# Patient Record
Sex: Female | Born: 1985
Health system: Southern US, Community
[De-identification: ages and names within clinical notes are randomized; demographics above are authoritative.]

## PROBLEM LIST (undated history)

## (undated) DIAGNOSIS — D219 Benign neoplasm of connective and other soft tissue, unspecified: Secondary | ICD-10-CM

---

## 2011-08-29 HISTORY — PX: MYOMECTOMY: SHX85

## 2016-01-24 ENCOUNTER — Encounter (HOSPITAL_COMMUNITY): Payer: Self-pay | Admitting: Radiology

## 2016-01-24 ENCOUNTER — Emergency Department (HOSPITAL_COMMUNITY): Payer: Medicaid Other

## 2016-01-24 ENCOUNTER — Emergency Department (HOSPITAL_COMMUNITY)
Admission: EM | Admit: 2016-01-24 | Discharge: 2016-01-24 | Disposition: A | Discharge status: Home / Self Care | Payer: Medicaid Other | Attending: Emergency Medicine | Admitting: Emergency Medicine

## 2016-01-24 DIAGNOSIS — R109 Unspecified abdominal pain: Secondary | ICD-10-CM

## 2016-01-24 DIAGNOSIS — K529 Noninfective gastroenteritis and colitis, unspecified: Secondary | ICD-10-CM

## 2016-01-24 DIAGNOSIS — R1033 Periumbilical pain: Secondary | ICD-10-CM | POA: Diagnosis present

## 2016-01-24 LAB — URINALYSIS, ROUTINE W REFLEX MICROSCOPIC
Bilirubin Urine: NEGATIVE
Glucose, UA: NEGATIVE mg/dL
Hgb urine dipstick: NEGATIVE
Ketones, ur: 15 mg/dL — AB
Leukocytes, UA: NEGATIVE
Nitrite: NEGATIVE
Protein, ur: NEGATIVE mg/dL
Specific Gravity, Urine: >1.046 — ABNORMAL HIGH (ref 1.005–1.030)
pH: 7.5 (ref 5.0–8.0)

## 2016-01-24 LAB — DIFFERENTIAL
Basophils Absolute: 0 K/uL (ref 0.0–0.1)
Basophils Relative: 0 %
Eosinophils Absolute: 0 K/uL (ref 0.0–0.7)
Eosinophils Relative: 0 %
Lymphocytes Relative: 17 %
Lymphs Abs: 1.4 K/uL (ref 0.7–4.0)
Monocytes Absolute: 0.2 K/uL (ref 0.1–1.0)
Monocytes Relative: 3 %
Neutro Abs: 6.7 K/uL (ref 1.7–7.7)
Neutrophils Relative %: 80 %

## 2016-01-24 LAB — COMPREHENSIVE METABOLIC PANEL
ALT: 21 U/L (ref 14–54)
AST: 28 U/L (ref 15–41)
Albumin: 5.2 g/dL — ABNORMAL HIGH (ref 3.5–5.0)
Alkaline Phosphatase: 64 U/L (ref 38–126)
Anion gap: 13 (ref 5–15)
BUN: 13 mg/dL (ref 6–20)
CHLORIDE: 101 mmol/L (ref 101–111)
CO2: 21 mmol/L — ABNORMAL LOW (ref 22–32)
Calcium: 10 mg/dL (ref 8.9–10.3)
Creatinine, Ser: 0.93 mg/dL (ref 0.44–1.00)
Glucose, Bld: 135 mg/dL — ABNORMAL HIGH (ref 65–99)
POTASSIUM: 3.6 mmol/L (ref 3.5–5.1)
Sodium: 135 mmol/L (ref 135–145)
Total Bilirubin: 0.8 mg/dL (ref 0.3–1.2)
Total Protein: 9.5 g/dL — ABNORMAL HIGH (ref 6.5–8.1)

## 2016-01-24 LAB — CBC
HEMATOCRIT: 38.3 % (ref 36.0–46.0)
Hemoglobin: 13.2 g/dL (ref 12.0–15.0)
MCH: 28.8 pg (ref 26.0–34.0)
MCHC: 34.5 g/dL (ref 30.0–36.0)
MCV: 83.6 fL (ref 78.0–100.0)
Platelets: 300 10*3/uL (ref 150–400)
RBC: 4.58 MIL/uL (ref 3.87–5.11)
RDW: 14.3 % (ref 11.5–15.5)
WBC: 8.4 10*3/uL (ref 4.0–10.5)

## 2016-01-24 LAB — LIPASE, BLOOD: Lipase: 34 U/L (ref 11–51)

## 2016-01-24 LAB — I-STAT BETA HCG BLOOD, ED (MC, WL, AP ONLY)
I-stat hCG, quantitative: <5 m[IU]/mL
I-stat hCG, quantitative: <5 m[IU]/mL

## 2016-01-24 MED ORDER — HYDROMORPHONE HCL 1 MG/ML IJ SOLN
1.0000 mg | Freq: Once | INTRAMUSCULAR | Status: AC
  Administered 2016-01-24: 1 mg via INTRAVENOUS
  Filled 2016-01-24: qty 1

## 2016-01-24 MED ORDER — DIATRIZOATE MEGLUMINE & SODIUM 66-10 % PO SOLN
30.0000 mL | Freq: Once | ORAL | Status: AC
  Administered 2016-01-24: 30 mL via ORAL

## 2016-01-24 MED ORDER — ONDANSETRON 4 MG PO TBDP: ORAL_TABLET | ORAL | Status: DC

## 2016-01-24 MED ORDER — IOPAMIDOL (ISOVUE-300) INJECTION 61%: 100.0000 mL | Freq: Once | INTRAVENOUS | Status: DC | PRN

## 2016-01-24 MED ORDER — CIPROFLOXACIN HCL 500 MG PO TABS: 500.0000 mg | ORAL_TABLET | Freq: Two times a day (BID) | ORAL | Status: DC

## 2016-01-24 MED ORDER — OXYCODONE HCL 5 MG PO TABS: 5.0000 mg | ORAL_TABLET | ORAL | Status: DC | PRN

## 2016-01-24 MED ORDER — ONDANSETRON HCL 4 MG/2ML IJ SOLN
4.0000 mg | Freq: Once | INTRAMUSCULAR | Status: AC
  Administered 2016-01-24: 4 mg via INTRAVENOUS
  Filled 2016-01-24: qty 2

## 2016-01-24 MED ORDER — SODIUM CHLORIDE 0.9 % IV SOLN
1000.0000 mL | Freq: Once | INTRAVENOUS | Status: AC
  Administered 2016-01-24: 1000 mL via INTRAVENOUS

## 2016-01-24 MED ORDER — SODIUM CHLORIDE 0.9 % IV SOLN
1000.0000 mL | INTRAVENOUS | Status: DC
  Administered 2016-01-24: 1000 mL via INTRAVENOUS

## 2016-01-24 MED ORDER — METRONIDAZOLE 500 MG PO TABS: 500.0000 mg | ORAL_TABLET | Freq: Two times a day (BID) | ORAL | Status: DC

## 2016-01-24 NOTE — ED Notes (Signed)
Pt arrived via EMS. Per EMS pt c/o Abdominal pain, pt is guarding below the navel, LMP ended last week, N/V for last 2 hours, she states she feels like when she had a miscarriage before her first child was born. She was given Zofran 4mg  IV in route.

## 2016-01-24 NOTE — ED Notes (Signed)
Discharge instructions, follow up care, and rx x4 reviewed with patient. Patient verbalized understanding. 

## 2016-01-24 NOTE — ED Notes (Signed)
Bed: [id number] Expected date:  Expected time:  Means of arrival:  Comments: 32 F abdominal pain

## 2016-01-24 NOTE — ED Notes (Signed)
Patient transported to CT 

## 2016-01-24 NOTE — ED Provider Notes (Signed)
CSN: [id number]     Arrival date & time 01/24/16  [id number] History   First MD Initiated Contact with Patient 01/24/16 0559     Chief Complaint  Patient presents with  . Abdominal Pain     (Consider location/radiation/quality/duration/timing/severity/associated sxs/prior Treatment) Patient is a 30 y.o. female presenting with abdominal pain. The history is provided by the patient.  Abdominal Pain She relates onset last night of periumbilical and lower abdominal pain which is crampy and throbbing and she rates at 10/10. She started vomiting about 2 hours before coming to the ED. Pain is not improved with emesis. She denies fever, chills, sweats. She denies constipation or diarrhea. She denies urinary symptoms. Last menses was one week ago was normal but she is not using any contraception. She states that that was her second menses since the birth of her most recent child. Pain is similar to what she had with a miscarriage in the past. She also relates a history of uterine fibroids.  No past medical history on file. No past surgical history on file. No family history on file. Social History  Substance Use Topics  . Smoking status: Not on file  . Smokeless tobacco: Not on file  . Alcohol Use: Not on file   OB History    No data available     Review of Systems  Gastrointestinal: Positive for abdominal pain.  All other systems reviewed and are negative.     Allergies  Review of patient's allergies indicates no known allergies.  Home Medications   Prior to Admission medications   Not on File   BP 116/97 mmHg  Pulse 90  Temp(Src) 98.6 F (37 C) (Oral)  Resp 26  SpO2 100% Physical Exam  Nursing note and vitals reviewed.  30 year old female, who appears to be in significant pain, but is in no acute distress. Vital signs are significant for tachypnea. Oxygen saturation is 100%, which is normal. Head is normocephalic and atraumatic. PERRLA, EOMI. Oropharynx is clear. Neck is  nontender and supple without adenopathy or JVD. Back is nontender and there is no CVA tenderness. Lungs are clear without rales, wheezes, or rhonchi. Chest is nontender. Heart has regular rate and rhythm without murmur. Abdomen is soft, flat, with moderate to severe periumbilical tenderness. There is no suprapubic tenderness. There is no rebound or guarding. There are no masses or hepatosplenomegaly and peristalsis is normoactive. Extremities have no cyanosis or edema, full range of motion is present. Skin is warm and dry without rash. Neurologic: Mental status is normal, cranial nerves are intact, there are no motor or sensory deficits.  ED Course  Procedures (including critical care time) Labs Review Results for orders placed or performed during the hospital encounter of 01/24/16  Lipase, blood  Result Value Ref Range   Lipase 34 11 - 51 U/L  Comprehensive metabolic panel  Result Value Ref Range   Sodium 135 135 - 145 mmol/L   Potassium 3.6 3.5 - 5.1 mmol/L   Chloride 101 101 - 111 mmol/L   CO2 21 (L) 22 - 32 mmol/L   Glucose, Bld 135 (H) 65 - 99 mg/dL   BUN 13 6 - 20 mg/dL   Creatinine, Ser 0.93 0.44 - 1.00 mg/dL   Calcium 10.0 8.9 - 10.3 mg/dL   Total Protein 9.5 (H) 6.5 - 8.1 g/dL   Albumin 5.2 (H) 3.5 - 5.0 g/dL   AST 28 15 - 41 U/L   ALT 21 14 - 54 U/L  Alkaline Phosphatase 64 38 - 126 U/L   Total Bilirubin 0.8 0.3 - 1.2 mg/dL   GFR calc non Af Amer >60 >60 mL/min   GFR calc Af Amer >60 >60 mL/min   Anion gap 13 5 - 15  CBC  Result Value Ref Range   WBC 8.4 4.0 - 10.5 K/uL   RBC 4.58 3.87 - 5.11 MIL/uL   Hemoglobin 13.2 12.0 - 15.0 g/dL   HCT 38.3 36.0 - 46.0 %   MCV 83.6 78.0 - 100.0 fL   MCH 28.8 26.0 - 34.0 pg   MCHC 34.5 30.0 - 36.0 g/dL   RDW 14.3 11.5 - 15.5 %   Platelets 300 150 - 400 K/uL  Differential  Result Value Ref Range   Neutrophils Relative % 80 %   Neutro Abs 6.7 1.7 - 7.7 K/uL   Lymphocytes Relative 17 %   Lymphs Abs 1.4 0.7 - 4.0 K/uL    Monocytes Relative 3 %   Monocytes Absolute 0.2 0.1 - 1.0 K/uL   Eosinophils Relative 0 %   Eosinophils Absolute 0.0 0.0 - 0.7 K/uL   Basophils Relative 0 %   Basophils Absolute 0.0 0.0 - 0.1 K/uL  I-Stat beta hCG blood, ED  Result Value Ref Range   I-stat hCG, quantitative <5.0 <5 mIU/mL   Comment 3          I-Stat beta hCG blood, ED ([facility], [facility], [facility] only)  Result Value Ref Range   I-stat hCG, quantitative <5.0 <5 mIU/mL   Comment 3           I have personally reviewed and evaluated these lab results as part of my medical decision-making.   MDM   Final diagnoses:  Abdominal pain, unspecified abdominal location    Abdominal pain of uncertain cause. Screening labs are obtained and she will be sent for CT of abdomen and pelvis. In the meantime, she is given IV fluids, hydromorphone, ondansetron.  She feels much better after above noted treatment. Laboratory evaluation is unremarkable. CT is pending. Case is signed out to Dr. Tyrone Nine.  Janet Fuel, MD [id number]

## 2016-01-24 NOTE — ED Provider Notes (Signed)
Received patient in turnover from Dr. Roxanne Mins, 30 year old female with periumbilical abdominal pain. CT scan concerning for enteritis. Will treat with Cipro and Flagyl. As I am treating with ciprofloxacin feel no reason to wait for UA. We'll have the patient follow-up with her family physician. Tolerating PO with improved abdominal pain on reexam.   Deno Etienne, DO 01/24/16 [id number]

## 2016-01-24 NOTE — Discharge Instructions (Signed)
Follow up with your family doc.  Return for inability to eat or drink or sudden worsening pain.  Abdominal Pain, Adult Many things can cause abdominal pain. Usually, abdominal pain is not caused by a disease and will improve without treatment. It can often be observed and treated at home. Your health care provider will do a physical exam and possibly order blood tests and X-rays to help determine the seriousness of your pain. However, in many cases, more time must pass before a clear cause of the pain can be found. Before that point, your health care provider may not know if you need more testing or further treatment. HOME CARE INSTRUCTIONS Monitor your abdominal pain for any changes. The following actions may help to alleviate any discomfort you are experiencing:  Only take over-the-counter or prescription medicines as directed by your health care provider.  Do not take laxatives unless directed to do so by your health care provider.  Try a clear liquid diet (broth, tea, or water) as directed by your health care provider. Slowly move to a bland diet as tolerated. SEEK MEDICAL CARE IF:  You have unexplained abdominal pain.  You have abdominal pain associated with nausea or diarrhea.  You have pain when you urinate or have a bowel movement.  You experience abdominal pain that wakes you in the night.  You have abdominal pain that is worsened or improved by eating food.  You have abdominal pain that is worsened with eating fatty foods.  You have a fever. SEEK IMMEDIATE MEDICAL CARE IF:  Your pain does not go away within 2 hours.  You keep throwing up (vomiting).  Your pain is felt only in portions of the abdomen, such as the right side or the left lower portion of the abdomen.  You pass bloody or black tarry stools. MAKE SURE YOU:  Understand these instructions.  Will watch your condition.  Will get help right away if you are not doing well or get worse.   This information is  not intended to replace advice given to you by your health care provider. Make sure you discuss any questions you have with your health care provider.   Document Released: 05/24/2005 Document Revised: 05/05/2015 Document Reviewed: 04/23/2013 Elsevier Interactive Patient Education [employer].

## 2016-01-25 ENCOUNTER — Inpatient Hospital Stay (HOSPITAL_COMMUNITY)
Admission: EM | Admit: 2016-01-25 | Discharge: 2016-01-31 | DRG: 336 | Disposition: A | Discharge status: Home / Self Care | Payer: Medicaid Other | Attending: Surgery | Admitting: Surgery

## 2016-01-25 ENCOUNTER — Encounter (HOSPITAL_COMMUNITY): Payer: Self-pay | Admitting: *Deleted

## 2016-01-25 DIAGNOSIS — K565 Intestinal adhesions [bands] with obstruction (postprocedural) (postinfection): Principal | ICD-10-CM | POA: Diagnosis present

## 2016-01-25 DIAGNOSIS — K567 Ileus, unspecified: Secondary | ICD-10-CM | POA: Diagnosis not present

## 2016-01-25 DIAGNOSIS — E860 Dehydration: Secondary | ICD-10-CM | POA: Diagnosis present

## 2016-01-25 DIAGNOSIS — D62 Acute posthemorrhagic anemia: Secondary | ICD-10-CM | POA: Diagnosis present

## 2016-01-25 DIAGNOSIS — R188 Other ascites: Secondary | ICD-10-CM | POA: Diagnosis present

## 2016-01-25 DIAGNOSIS — R040 Epistaxis: Secondary | ICD-10-CM | POA: Diagnosis not present

## 2016-01-25 DIAGNOSIS — E875 Hyperkalemia: Secondary | ICD-10-CM | POA: Diagnosis present

## 2016-01-25 DIAGNOSIS — K56609 Unspecified intestinal obstruction, unspecified as to partial versus complete obstruction: Secondary | ICD-10-CM | POA: Diagnosis present

## 2016-01-25 HISTORY — DX: Benign neoplasm of connective and other soft tissue, unspecified: D21.9

## 2016-01-25 NOTE — ED Notes (Addendum)
Pt arrives to the ER via EMS for complaints of abd pain; pt was seen yesterday for the same thing and was prescribed 7 Oxycodone tablets; pt has taken all the tablets and continues to c/o pain; pt c/o pain in all 4 quads; no nausea or vomiting noted; pt labs and CT scan were WNL yesterday

## 2016-01-25 NOTE — ED Notes (Signed)
Per EMS pt was ambulatory at house and ambulatory to the ambulance; upon transferring pt from the EMS stretcher to the wheelchair pt attempted to fall out in the floor and when was advised that she would be going to the lobby pt attempted to throw herself from the wheelchair

## 2016-01-26 ENCOUNTER — Emergency Department (HOSPITAL_COMMUNITY): Payer: Medicaid Other

## 2016-01-26 ENCOUNTER — Inpatient Hospital Stay (HOSPITAL_COMMUNITY): Payer: Medicaid Other

## 2016-01-26 ENCOUNTER — Encounter (HOSPITAL_COMMUNITY): Admission: EM | Disposition: A | Discharge status: Home / Self Care | Payer: Self-pay | Source: Home / Self Care

## 2016-01-26 ENCOUNTER — Encounter (HOSPITAL_COMMUNITY): Payer: Self-pay | Admitting: Emergency Medicine

## 2016-01-26 ENCOUNTER — Inpatient Hospital Stay (HOSPITAL_COMMUNITY): Payer: Medicaid Other | Admitting: Anesthesiology

## 2016-01-26 DIAGNOSIS — K56609 Unspecified intestinal obstruction, unspecified as to partial versus complete obstruction: Secondary | ICD-10-CM | POA: Diagnosis present

## 2016-01-26 DIAGNOSIS — E860 Dehydration: Secondary | ICD-10-CM | POA: Diagnosis present

## 2016-01-26 DIAGNOSIS — D62 Acute posthemorrhagic anemia: Secondary | ICD-10-CM | POA: Diagnosis present

## 2016-01-26 DIAGNOSIS — D219 Benign neoplasm of connective and other soft tissue, unspecified: Secondary | ICD-10-CM | POA: Insufficient documentation

## 2016-01-26 DIAGNOSIS — R109 Unspecified abdominal pain: Secondary | ICD-10-CM | POA: Diagnosis present

## 2016-01-26 DIAGNOSIS — R040 Epistaxis: Secondary | ICD-10-CM | POA: Diagnosis not present

## 2016-01-26 DIAGNOSIS — K567 Ileus, unspecified: Secondary | ICD-10-CM | POA: Diagnosis not present

## 2016-01-26 DIAGNOSIS — K565 Intestinal adhesions [bands] with obstruction (postprocedural) (postinfection): Secondary | ICD-10-CM | POA: Diagnosis present

## 2016-01-26 DIAGNOSIS — E875 Hyperkalemia: Secondary | ICD-10-CM

## 2016-01-26 DIAGNOSIS — K5669 Other intestinal obstruction: Secondary | ICD-10-CM

## 2016-01-26 DIAGNOSIS — R188 Other ascites: Secondary | ICD-10-CM | POA: Diagnosis present

## 2016-01-26 HISTORY — PX: LAPAROTOMY: SHX154

## 2016-01-26 LAB — MAGNESIUM: MAGNESIUM: 2.1 mg/dL (ref 1.7–2.4)

## 2016-01-26 LAB — CBC WITH DIFFERENTIAL/PLATELET
BASOS ABS: 0 10*3/uL (ref 0.0–0.1)
BASOS PCT: 0 %
EOS ABS: 0 10*3/uL (ref 0.0–0.7)
Eosinophils Relative: 0 %
HEMATOCRIT: 42.2 % (ref 36.0–46.0)
HEMOGLOBIN: 14.4 g/dL (ref 12.0–15.0)
Lymphocytes Relative: 12 %
Lymphs Abs: 1.2 10*3/uL (ref 0.7–4.0)
MCH: 29.1 pg (ref 26.0–34.0)
MCHC: 34.1 g/dL (ref 30.0–36.0)
MCV: 85.3 fL (ref 78.0–100.0)
Monocytes Absolute: 0.5 10*3/uL (ref 0.1–1.0)
Monocytes Relative: 5 %
NEUTROS ABS: 7.9 10*3/uL — AB (ref 1.7–7.7)
NEUTROS PCT: 83 %
Platelets: 314 10*3/uL (ref 150–400)
RBC: 4.95 MIL/uL (ref 3.87–5.11)
RDW: 14.6 % (ref 11.5–15.5)
WBC: 9.5 10*3/uL (ref 4.0–10.5)

## 2016-01-26 LAB — URINALYSIS, ROUTINE W REFLEX MICROSCOPIC
GLUCOSE, UA: NEGATIVE mg/dL
HGB URINE DIPSTICK: NEGATIVE
Ketones, ur: >80 mg/dL — AB
Nitrite: NEGATIVE
PH: 5.5 (ref 5.0–8.0)
Protein, ur: 100 mg/dL — AB
SPECIFIC GRAVITY, URINE: 1.033 — AB (ref 1.005–1.030)

## 2016-01-26 LAB — SURGICAL PCR SCREEN
MRSA, PCR: NEGATIVE
Staphylococcus aureus: NEGATIVE

## 2016-01-26 LAB — TYPE AND SCREEN
ABO/RH(D): A POS
ANTIBODY SCREEN: NEGATIVE

## 2016-01-26 LAB — COMPREHENSIVE METABOLIC PANEL
ALBUMIN: 4.9 g/dL (ref 3.5–5.0)
ALT: 17 U/L (ref 14–54)
AST: 28 U/L (ref 15–41)
Alkaline Phosphatase: 61 U/L (ref 38–126)
Anion gap: 12 (ref 5–15)
BILIRUBIN TOTAL: 1.5 mg/dL — AB (ref 0.3–1.2)
BUN: 14 mg/dL (ref 6–20)
CHLORIDE: 99 mmol/L — AB (ref 101–111)
CO2: 22 mmol/L (ref 22–32)
CREATININE: 1.18 mg/dL — AB (ref 0.44–1.00)
Calcium: 10.3 mg/dL (ref 8.9–10.3)
GFR calc Af Amer: >60 mL/min (ref >60)
GLUCOSE: 99 mg/dL (ref 65–99)
POTASSIUM: 3.9 mmol/L (ref 3.5–5.1)
Sodium: 133 mmol/L — ABNORMAL LOW (ref 135–145)
Total Protein: 9.1 g/dL — ABNORMAL HIGH (ref 6.5–8.1)

## 2016-01-26 LAB — POTASSIUM: Potassium: 3.3 mmol/L — ABNORMAL LOW (ref 3.5–5.1)

## 2016-01-26 LAB — I-STAT CHEM 8, ED
BUN: 18 mg/dL (ref 6–20)
CREATININE: 1.1 mg/dL — AB (ref 0.44–1.00)
Calcium, Ion: 1.12 mmol/L (ref 1.12–1.23)
Chloride: 100 mmol/L — ABNORMAL LOW (ref 101–111)
Glucose, Bld: 97 mg/dL (ref 65–99)
HEMATOCRIT: 47 % — AB (ref 36.0–46.0)
HEMOGLOBIN: 16 g/dL — AB (ref 12.0–15.0)
POTASSIUM: 5.6 mmol/L — AB (ref 3.5–5.1)
SODIUM: 135 mmol/L (ref 135–145)
TCO2: 26 mmol/L (ref 0–100)

## 2016-01-26 LAB — URINE MICROSCOPIC-ADD ON: RBC / HPF: NONE SEEN RBC/hpf (ref 0–5)

## 2016-01-26 LAB — ABO/RH: ABO/RH(D): A POS

## 2016-01-26 SURGERY — LAPAROTOMY, EXPLORATORY
Anesthesia: General | Site: Abdomen

## 2016-01-26 MED ORDER — ACETAMINOPHEN 10 MG/ML IV SOLN
INTRAVENOUS | Status: AC
Start: 2016-01-26 — End: 2016-01-26
  Filled 2016-01-26: qty 100

## 2016-01-26 MED ORDER — LIDOCAINE HCL (CARDIAC) 20 MG/ML IV SOLN
INTRAVENOUS | Status: AC
  Filled 2016-01-26: qty 5

## 2016-01-26 MED ORDER — ONDANSETRON HCL 4 MG/2ML IJ SOLN: 4.0000 mg | INTRAMUSCULAR | Status: DC | PRN

## 2016-01-26 MED ORDER — SUGAMMADEX SODIUM 200 MG/2ML IV SOLN
INTRAVENOUS | Status: AC
  Filled 2016-01-26: qty 2

## 2016-01-26 MED ORDER — PANTOPRAZOLE SODIUM 40 MG IV SOLR
40.0000 mg | Freq: Every day | INTRAVENOUS | Status: DC
  Administered 2016-01-26 – 2016-01-30 (×5): 40 mg via INTRAVENOUS
  Filled 2016-01-26 (×5): qty 40

## 2016-01-26 MED ORDER — ONDANSETRON HCL 4 MG/2ML IJ SOLN: 4.0000 mg | Freq: Four times a day (QID) | INTRAMUSCULAR | Status: DC | PRN

## 2016-01-26 MED ORDER — HYDROMORPHONE HCL 1 MG/ML IJ SOLN
INTRAMUSCULAR | Status: AC
  Filled 2016-01-26: qty 1

## 2016-01-26 MED ORDER — DEXAMETHASONE SODIUM PHOSPHATE 10 MG/ML IJ SOLN
INTRAMUSCULAR | Status: AC
  Filled 2016-01-26: qty 1

## 2016-01-26 MED ORDER — PROPOFOL 10 MG/ML IV BOLUS
INTRAVENOUS | Status: DC | PRN
  Administered 2016-01-26: 200 mg via INTRAVENOUS

## 2016-01-26 MED ORDER — BUTAMBEN-TETRACAINE-BENZOCAINE 2-2-14 % EX AERO
1.0000 | INHALATION_SPRAY | Freq: Once | CUTANEOUS | Status: AC
  Administered 2016-01-26: 1 via TOPICAL

## 2016-01-26 MED ORDER — CEFAZOLIN SODIUM-DEXTROSE 2-4 GM/100ML-% IV SOLN
2.0000 g | Freq: Three times a day (TID) | INTRAVENOUS | Status: DC
Start: 2016-01-26 — End: 2016-01-26
  Filled 2016-01-26: qty 100

## 2016-01-26 MED ORDER — ALBUTEROL SULFATE (2.5 MG/3ML) 0.083% IN NEBU: 2.5000 mg | INHALATION_SOLUTION | RESPIRATORY_TRACT | Status: DC | PRN

## 2016-01-26 MED ORDER — SUGAMMADEX SODIUM 200 MG/2ML IV SOLN
INTRAVENOUS | Status: DC | PRN
  Administered 2016-01-26: 200 mg via INTRAVENOUS

## 2016-01-26 MED ORDER — LIDOCAINE VISCOUS 2 % MT SOLN
15.0000 mL | Freq: Once | OROMUCOSAL | Status: AC
  Administered 2016-01-26: 15 mL via OROMUCOSAL
  Filled 2016-01-26: qty 15

## 2016-01-26 MED ORDER — FENTANYL CITRATE (PF) 100 MCG/2ML IJ SOLN
100.0000 ug | Freq: Once | INTRAMUSCULAR | Status: AC
  Administered 2016-01-26: 100 ug via INTRAVENOUS
  Filled 2016-01-26: qty 2

## 2016-01-26 MED ORDER — LORAZEPAM 2 MG/ML IJ SOLN
0.5000 mg | Freq: Once | INTRAMUSCULAR | Status: AC
  Administered 2016-01-26: 0.5 mg via INTRAVENOUS
  Filled 2016-01-26: qty 1

## 2016-01-26 MED ORDER — ONDANSETRON HCL 4 MG/2ML IJ SOLN
INTRAMUSCULAR | Status: DC | PRN
  Administered 2016-01-26: 4 mg via INTRAVENOUS

## 2016-01-26 MED ORDER — IOPAMIDOL (ISOVUE-300) INJECTION 61%
100.0000 mL | Freq: Once | INTRAVENOUS | Status: AC | PRN
  Administered 2016-01-26: 100 mL via INTRAVENOUS

## 2016-01-26 MED ORDER — ONDANSETRON 4 MG PO TBDP: 4.0000 mg | ORAL_TABLET | Freq: Four times a day (QID) | ORAL | Status: DC | PRN

## 2016-01-26 MED ORDER — MIDAZOLAM HCL 2 MG/2ML IJ SOLN
INTRAMUSCULAR | Status: AC
  Filled 2016-01-26: qty 2

## 2016-01-26 MED ORDER — GLYCOPYRROLATE 0.2 MG/ML IJ SOLN
INTRAMUSCULAR | Status: AC
  Filled 2016-01-26: qty 1

## 2016-01-26 MED ORDER — HYDROMORPHONE HCL 1 MG/ML IJ SOLN
0.2500 mg | INTRAMUSCULAR | Status: DC | PRN
  Administered 2016-01-26 (×2): 0.5 mg via INTRAVENOUS

## 2016-01-26 MED ORDER — ACETAMINOPHEN 650 MG RE SUPP
650.0000 mg | Freq: Four times a day (QID) | RECTAL | Status: DC | PRN
Start: 2016-01-26 — End: 2016-01-26

## 2016-01-26 MED ORDER — ONDANSETRON 4 MG PO TBDP
4.0000 mg | ORAL_TABLET | Freq: Once | ORAL | Status: AC | PRN
  Administered 2016-01-26: 4 mg via ORAL
  Filled 2016-01-26: qty 1

## 2016-01-26 MED ORDER — MENTHOL 3 MG MT LOZG
1.0000 | LOZENGE | OROMUCOSAL | Status: DC | PRN
  Administered 2016-01-26: 3 mg via ORAL
  Filled 2016-01-26: qty 9

## 2016-01-26 MED ORDER — LACTATED RINGERS IV SOLN
INTRAVENOUS | Status: DC | PRN
  Administered 2016-01-26 (×3): via INTRAVENOUS

## 2016-01-26 MED ORDER — LIDOCAINE HCL (CARDIAC) 20 MG/ML IV SOLN
INTRAVENOUS | Status: DC | PRN
  Administered 2016-01-26: 25 mg via INTRATRACHEAL

## 2016-01-26 MED ORDER — ONDANSETRON HCL 4 MG/2ML IJ SOLN
INTRAMUSCULAR | Status: AC
  Filled 2016-01-26: qty 2

## 2016-01-26 MED ORDER — LACTATED RINGERS IV SOLN: INTRAVENOUS | Status: DC | PRN

## 2016-01-26 MED ORDER — CEFAZOLIN SODIUM-DEXTROSE 2-4 GM/100ML-% IV SOLN
INTRAVENOUS | Status: AC
  Filled 2016-01-26: qty 100

## 2016-01-26 MED ORDER — DIPHENHYDRAMINE HCL 50 MG/ML IJ SOLN: 12.5000 mg | Freq: Four times a day (QID) | INTRAMUSCULAR | Status: DC | PRN

## 2016-01-26 MED ORDER — SODIUM CHLORIDE 0.9 % IV BOLUS (SEPSIS)
1000.0000 mL | Freq: Once | INTRAVENOUS | Status: AC
Start: 2016-01-26 — End: 2016-01-26
  Administered 2016-01-26: 1000 mL via INTRAVENOUS

## 2016-01-26 MED ORDER — ZOLPIDEM TARTRATE 5 MG PO TABS
5.0000 mg | ORAL_TABLET | Freq: Every evening | ORAL | Status: DC | PRN
  Administered 2016-01-28: 5 mg via ORAL
  Filled 2016-01-26 (×2): qty 1

## 2016-01-26 MED ORDER — POTASSIUM CHLORIDE 10 MEQ/100ML IV SOLN
10.0000 meq | INTRAVENOUS | Status: DC
  Filled 2016-01-26 (×4): qty 100

## 2016-01-26 MED ORDER — CETYLPYRIDINIUM CHLORIDE 0.05 % MT LIQD
7.0000 mL | Freq: Two times a day (BID) | OROMUCOSAL | Status: DC
  Administered 2016-01-26 – 2016-01-31 (×3): 7 mL via OROMUCOSAL

## 2016-01-26 MED ORDER — SODIUM CHLORIDE 0.9 % IV BOLUS (SEPSIS)
1000.0000 mL | Freq: Once | INTRAVENOUS | Status: AC
  Administered 2016-01-26: 1000 mL via INTRAVENOUS

## 2016-01-26 MED ORDER — SUFENTANIL CITRATE 50 MCG/ML IV SOLN
INTRAVENOUS | Status: DC | PRN
  Administered 2016-01-26: 2.5 ug via INTRAVENOUS
  Administered 2016-01-26: 10 ug via INTRAVENOUS
  Administered 2016-01-26: 12.5 ug via INTRAVENOUS
  Administered 2016-01-26: 10 ug via INTRAVENOUS

## 2016-01-26 MED ORDER — ONDANSETRON HCL 4 MG PO TABS: 4.0000 mg | ORAL_TABLET | Freq: Four times a day (QID) | ORAL | Status: DC | PRN

## 2016-01-26 MED ORDER — ACETAMINOPHEN 325 MG PO TABS: 650.0000 mg | ORAL_TABLET | Freq: Four times a day (QID) | ORAL | Status: DC | PRN

## 2016-01-26 MED ORDER — 0.9 % SODIUM CHLORIDE (POUR BTL) OPTIME
TOPICAL | Status: DC | PRN
  Administered 2016-01-26: 2000 mL

## 2016-01-26 MED ORDER — SENNA 8.6 MG PO TABS
1.0000 | ORAL_TABLET | Freq: Two times a day (BID) | ORAL | Status: DC
Start: 2016-01-26 — End: 2016-01-26

## 2016-01-26 MED ORDER — MORPHINE SULFATE (PF) 2 MG/ML IV SOLN
2.0000 mg | INTRAVENOUS | Status: DC | PRN
  Administered 2016-01-26: 2 mg via INTRAVENOUS
  Filled 2016-01-26: qty 1

## 2016-01-26 MED ORDER — ACETAMINOPHEN 10 MG/ML IV SOLN
INTRAVENOUS | Status: DC | PRN
  Administered 2016-01-26: 1000 mg via INTRAVENOUS

## 2016-01-26 MED ORDER — SUCCINYLCHOLINE CHLORIDE 20 MG/ML IJ SOLN
INTRAMUSCULAR | Status: DC | PRN
  Administered 2016-01-26: 140 mg via INTRAVENOUS

## 2016-01-26 MED ORDER — MIDAZOLAM HCL 5 MG/5ML IJ SOLN
INTRAMUSCULAR | Status: DC | PRN
  Administered 2016-01-26: 0.5 mg via INTRAVENOUS

## 2016-01-26 MED ORDER — ROCURONIUM BROMIDE 100 MG/10ML IV SOLN
INTRAVENOUS | Status: AC
  Filled 2016-01-26: qty 1

## 2016-01-26 MED ORDER — CEFAZOLIN SODIUM-DEXTROSE 2-4 GM/100ML-% IV SOLN
2.0000 g | Freq: Three times a day (TID) | INTRAVENOUS | Status: AC
  Administered 2016-01-26: 2 g via INTRAVENOUS
  Filled 2016-01-26: qty 100

## 2016-01-26 MED ORDER — SILVER NITRATE-POT NITRATE 75-25 % EX MISC
1.0000 | Freq: Once | CUTANEOUS | Status: AC
  Administered 2016-01-26: 1 via TOPICAL

## 2016-01-26 MED ORDER — GLYCOPYRROLATE 0.2 MG/ML IJ SOLN
INTRAMUSCULAR | Status: DC | PRN
  Administered 2016-01-26: .2 mg via INTRAVENOUS

## 2016-01-26 MED ORDER — KCL IN DEXTROSE-NACL 20-5-0.9 MEQ/L-%-% IV SOLN
INTRAVENOUS | Status: DC
  Administered 2016-01-26: 1000 mL via INTRAVENOUS
  Administered 2016-01-27 – 2016-01-30 (×4): via INTRAVENOUS
  Filled 2016-01-26 (×13): qty 1000

## 2016-01-26 MED ORDER — HEPARIN SODIUM (PORCINE) 5000 UNIT/ML IJ SOLN
5000.0000 [IU] | Freq: Three times a day (TID) | INTRAMUSCULAR | Status: DC
  Administered 2016-01-27 – 2016-01-31 (×9): 5000 [IU] via SUBCUTANEOUS
  Filled 2016-01-26 (×9): qty 1

## 2016-01-26 MED ORDER — MORPHINE SULFATE 2 MG/ML IV SOLN
INTRAVENOUS | Status: DC
  Administered 2016-01-26: 1.5 mg via INTRAVENOUS
  Administered 2016-01-26: 6 mg via INTRAVENOUS
  Administered 2016-01-26: 14:00:00 via INTRAVENOUS
  Administered 2016-01-26: 10.5 mg via INTRAVENOUS
  Administered 2016-01-27: 6 mg via INTRAVENOUS
  Administered 2016-01-27: 13.5 mg via INTRAVENOUS
  Administered 2016-01-27: 7.5 mg via INTRAVENOUS
  Administered 2016-01-27: 16:00:00 via INTRAVENOUS
  Administered 2016-01-28 (×2): 1.5 mg via INTRAVENOUS
  Administered 2016-01-28: 3 mg via INTRAVENOUS
  Administered 2016-01-28: 9 mg via INTRAVENOUS
  Administered 2016-01-28: 0 mg via INTRAVENOUS
  Administered 2016-01-29: 4.5 mg via INTRAVENOUS
  Administered 2016-01-29 (×2): 0 mg via INTRAVENOUS
  Administered 2016-01-29: 3 mg via INTRAVENOUS
  Administered 2016-01-29: 1.5 mg via INTRAVENOUS
  Filled 2016-01-26 (×2): qty 25

## 2016-01-26 MED ORDER — SODIUM CHLORIDE 0.9 % IV SOLN
INTRAVENOUS | Status: DC
  Administered 2016-01-26: 10:00:00 via INTRAVENOUS

## 2016-01-26 MED ORDER — CEFAZOLIN SODIUM-DEXTROSE 2-3 GM-% IV SOLR
INTRAVENOUS | Status: DC | PRN
  Administered 2016-01-26: 2 g via INTRAVENOUS

## 2016-01-26 MED ORDER — PROPOFOL 10 MG/ML IV BOLUS
INTRAVENOUS | Status: AC
  Filled 2016-01-26: qty 40

## 2016-01-26 MED ORDER — SUFENTANIL CITRATE 50 MCG/ML IV SOLN
INTRAVENOUS | Status: AC
  Filled 2016-01-26: qty 1

## 2016-01-26 MED ORDER — MORPHINE SULFATE (PF) 2 MG/ML IV SOLN: 1.0000 mg | INTRAVENOUS | Status: DC | PRN

## 2016-01-26 MED ORDER — ENOXAPARIN SODIUM 40 MG/0.4ML ~~LOC~~ SOLN: 40.0000 mg | Freq: Every day | SUBCUTANEOUS | Status: DC

## 2016-01-26 MED ORDER — LORAZEPAM 2 MG/ML IJ SOLN
0.5000 mg | Freq: Once | INTRAMUSCULAR | Status: DC
  Filled 2016-01-26: qty 1

## 2016-01-26 MED ORDER — SODIUM CHLORIDE 0.9% FLUSH: 9.0000 mL | INTRAVENOUS | Status: DC | PRN

## 2016-01-26 MED ORDER — DEXAMETHASONE SODIUM PHOSPHATE 10 MG/ML IJ SOLN
INTRAMUSCULAR | Status: DC | PRN
  Administered 2016-01-26: 10 mg via INTRAVENOUS

## 2016-01-26 MED ORDER — SODIUM CHLORIDE 0.9 % IJ SOLN
INTRAMUSCULAR | Status: AC
Start: 2016-01-26 — End: 2016-01-26
  Filled 2016-01-26: qty 10

## 2016-01-26 MED ORDER — ONDANSETRON HCL 4 MG/2ML IJ SOLN
4.0000 mg | Freq: Once | INTRAMUSCULAR | Status: AC
  Administered 2016-01-26: 4 mg via INTRAVENOUS
  Filled 2016-01-26: qty 2

## 2016-01-26 MED ORDER — ROCURONIUM BROMIDE 100 MG/10ML IV SOLN
INTRAVENOUS | Status: DC | PRN
  Administered 2016-01-26: 40 mg via INTRAVENOUS

## 2016-01-26 MED ORDER — NALOXONE HCL 0.4 MG/ML IJ SOLN: 0.4000 mg | INTRAMUSCULAR | Status: DC | PRN

## 2016-01-26 MED ORDER — DIPHENHYDRAMINE HCL 12.5 MG/5ML PO ELIX: 12.5000 mg | ORAL_SOLUTION | Freq: Four times a day (QID) | ORAL | Status: DC | PRN

## 2016-01-26 SURGICAL SUPPLY — 40 items
APPLICATOR COTTON TIP 6IN STRL (MISCELLANEOUS) ×4 IMPLANT
BLADE EXTENDED COATED 6.5IN (ELECTRODE) IMPLANT
BLADE HEX COATED 2.75 (ELECTRODE) ×2 IMPLANT
COVER MAYO STAND STRL (DRAPES) ×2 IMPLANT
COVER SURGICAL LIGHT HANDLE (MISCELLANEOUS) ×2 IMPLANT
DRAIN CHANNEL 19F RND (DRAIN) IMPLANT
DRAPE LAPAROSCOPIC ABDOMINAL (DRAPES) ×2 IMPLANT
DRAPE UTILITY XL STRL (DRAPES) ×2 IMPLANT
DRAPE WARM FLUID 44X44 (DRAPE) ×2 IMPLANT
DRSG OPSITE POSTOP 4X8 (GAUZE/BANDAGES/DRESSINGS) ×2 IMPLANT
ELECT REM PT RETURN 9FT ADLT (ELECTROSURGICAL) ×2
ELECTRODE REM PT RTRN 9FT ADLT (ELECTROSURGICAL) ×1 IMPLANT
EVACUATOR DRAINAGE 10X20 100CC (DRAIN) IMPLANT
EVACUATOR SILICONE 100CC (DRAIN)
GAUZE SPONGE 4X4 12PLY STRL (GAUZE/BANDAGES/DRESSINGS) ×2 IMPLANT
GLOVE ECLIPSE 8.0 STRL XLNG CF (GLOVE) ×2 IMPLANT
GLOVE INDICATOR 8.0 STRL GRN (GLOVE) ×4 IMPLANT
GOWN STRL REUS W/TWL XL LVL3 (GOWN DISPOSABLE) ×4 IMPLANT
HANDLE SUCTION POOLE (INSTRUMENTS) ×1 IMPLANT
KIT BASIN OR (CUSTOM PROCEDURE TRAY) ×2 IMPLANT
NS IRRIG 1000ML POUR BTL (IV SOLUTION) ×2 IMPLANT
PACK GENERAL/GYN (CUSTOM PROCEDURE TRAY) ×2 IMPLANT
SPONGE LAP 18X18 X RAY DECT (DISPOSABLE) ×4 IMPLANT
STAPLER VISISTAT 35W (STAPLE) ×2 IMPLANT
SUCTION POOLE HANDLE (INSTRUMENTS) ×2
SUT PDS AB 1 CTX 36 (SUTURE) IMPLANT
SUT PDS AB 1 TP1 96 (SUTURE) ×4 IMPLANT
SUT SILK 2 0 (SUTURE) ×1
SUT SILK 2 0 SH CR/8 (SUTURE) ×2 IMPLANT
SUT SILK 2-0 18XBRD TIE 12 (SUTURE) ×1 IMPLANT
SUT SILK 3 0 (SUTURE) ×1
SUT SILK 3 0 SH CR/8 (SUTURE) ×2 IMPLANT
SUT SILK 3-0 18XBRD TIE 12 (SUTURE) ×1 IMPLANT
SUT VIC AB 3-0 SH 18 (SUTURE) IMPLANT
SUT VICRYL 2 0 18  UND BR (SUTURE)
SUT VICRYL 2 0 18 UND BR (SUTURE) IMPLANT
TOWEL OR 17X26 10 PK STRL BLUE (TOWEL DISPOSABLE) ×4 IMPLANT
TOWEL OR NON WOVEN STRL DISP B (DISPOSABLE) ×2 IMPLANT
TRAY FOLEY W/METER SILVER 14FR (SET/KITS/TRAYS/PACK) ×2 IMPLANT
TRAY FOLEY W/METER SILVER 16FR (SET/KITS/TRAYS/PACK) ×2 IMPLANT

## 2016-01-26 NOTE — Anesthesia Procedure Notes (Signed)
Procedure Name: Intubation Date/Time: 01/26/2016 12:10 PM Performed by: Lissa Morales Pre-anesthesia Checklist: Patient identified, Emergency Drugs available, Suction available and Patient being monitored Patient Re-evaluated:Patient Re-evaluated prior to inductionOxygen Delivery Method: Circle system utilized Preoxygenation: Pre-oxygenation with 100% oxygen Intubation Type: IV induction, Rapid sequence and Cricoid Pressure applied Ventilation: Mask ventilation without difficulty Laryngoscope Size: Glidescope and 4 Tube type: Oral Tube size: 7.5 mm Number of attempts: 1 Airway Equipment and Method: Stylet and Oral airway Placement Confirmation: ETT inserted through vocal cords under direct vision,  positive ETCO2 and breath sounds checked- equal and bilateral Secured at: 22 cm Tube secured with: Tape Dental Injury: Teeth and Oropharynx as per pre-operative assessment  Difficulty Due To: Difficult Airway- due to limited oral opening Comments: Very limited mouth opening, elective glidescope with good visualization

## 2016-01-26 NOTE — ED Notes (Signed)
Hospitalist at bedside 

## 2016-01-26 NOTE — H&P (Signed)
History and Physical    Janet Alvarez [id number] DOB: 1985/09/28 DOA: 01/25/2016  PCP: No PCP Per Patient PCPs in [place]. Patient in the process of moving to [place]. Patient coming from: Home  Chief Complaint: Abdominal pain  HPI: Janet Alvarez is a 29 y.o. female with medical history significant of fibroids status post myomectomy in January 2013, history of C-sections 2 (07/2013, 02/2015) who presents to the ED with worsening abdominal pain 2 days. Patient states she was seen in the ED 1 day prior to admission with worsening abdominal pain was placed on antibiotics and given some pain medications with a diagnosis of enteritis and discharged back home. Patient stated that symptoms worsened over the course of the day with subjective fevers, chills. Patient stated had an episode of some chest pain 1 day prior to admission which has since resolved. Patient also endorses some generalized weakness. Patient states last bowel movement was 2 days prior to admission. Patient denies any flatus. Patient endorses some nausea and emesis. Patient denies any shortness of breath, no diarrhea, no dysuria, no melena, no hematemesis, no hematochezia, no visual changes, no headaches, no asymmetric weakness, no cough, no hematuria.  ED Course: Patient was seen in the ED compressive metabolic profile obtained at a sodium of 133 chloride of 99 creatinine of 1.18, potassium of 5.6, protein of 9.1, bilirubin of 1.5. CBC unremarkable. Urinalysis which was done had greater than 80 ketones small leukocytes nitrite -0-5 WBCs. Acute abdominal series showed progressive small bowel dilatation with air-fluid levels. Progressive ileus versus developing small bowel obstruction. NG tube was placed with traumatic nosebleed noted.  Review of Systems: As per HPI otherwise 10 point review of systems negative.   Past Medical History  Diagnosis Date  . Fibroids     Past Surgical History  Procedure Laterality Date  .  Myomectomy  08/2011  . Cesarean section  DEC 2014, JULY 2016     reports that she has never smoked. She does not have any smokeless tobacco history on file. She reports that she does not drink alcohol or use illicit drugs.  Allergies  Allergen Reactions  . Vicodin [Hydrocodone-Acetaminophen] Other (See Comments)    DIZZINESS    Family History  Problem Relation Age of Onset  . Fibroids Mother    Mother alive age 70 with a history of fibroids. Father alive health status unknown. Patient is currently unemployed.  Prior to Admission medications   Medication Sig Start Date End Date Taking? Authorizing Provider  ciprofloxacin (CIPRO) 500 MG tablet Take 1 tablet (500 mg total) by mouth 2 (two) times daily. One po bid x 7 days 01/24/16  Yes Deno Etienne, DO  metroNIDAZOLE (FLAGYL) 500 MG tablet Take 1 tablet (500 mg total) by mouth 2 (two) times daily. One po bid x 7 days 01/24/16  Yes Deno Etienne, DO  ondansetron (ZOFRAN ODT) 4 MG disintegrating tablet 4mg  ODT q4 hours prn nausea/vomit 01/24/16  Yes Deno Etienne, DO  oxyCODONE (ROXICODONE) 5 MG immediate release tablet Take 1 tablet (5 mg total) by mouth every 4 (four) hours as needed for severe pain. Patient not taking: Reported on 01/26/2016 01/24/16   Deno Etienne, DO    Physical Exam: Filed Vitals:   01/26/16 0600 01/26/16 0702 01/26/16 0733 01/26/16 0834  BP: 120/80 120/78 138/105 130/89  Pulse: 73 74 76 75  Temp: 98.5 F (36.9 C) 98.6 F (37 C)    TempSrc: Oral Oral    Resp: 18 17 16 18   SpO2: 100% 100%  100% 95%      Constitutional: Mild discomfort with complaints of abdominal pain. NG tube in place with blood noted around nares. Filed Vitals:   01/26/16 0600 01/26/16 0702 01/26/16 0733 01/26/16 0834  BP: 120/80 120/78 138/105 130/89  Pulse: 73 74 76 75  Temp: 98.5 F (36.9 C) 98.6 F (37 C)    TempSrc: Oral Oral    Resp: 18 17 16 18   SpO2: 100% 100% 100% 95%   Eyes: PERRLA, lids and conjunctivae normal ENMT: Mucous membranes are  dry. Posterior pharynx clear of any exudate or lesions.Normal dentition. Nares with blood noted around NG tube placement site. Neck: normal, supple, no masses, no thyromegaly Respiratory: clear to auscultation bilaterally, no wheezing, no crackles. Normal respiratory effort. No accessory muscle use.  Cardiovascular: Regular rate and rhythm, no murmurs / rubs / gallops. No extremity edema. 2+ pedal pulses. No carotid bruits.  Abdomen: Diffuse abdominal tenderness. Absent bowel sounds. Soft. Nondistended. No hepatosplenomegaly.  Musculoskeletal: no clubbing / cyanosis. No joint deformity upper and lower extremities. Good ROM, no contractures. Normal muscle tone.  Skin: no rashes, lesions, ulcers. No induration Neurologic: CN 2-12 grossly intact. Sensation intact, DTR normal. Strength 5/5 in all 4.  Psychiatric: Normal judgment and insight. Alert and oriented x 3. Normal mood.   Labs on Admission: I have personally reviewed following labs and imaging studies  CBC:  Recent Labs Lab 01/24/16 0620 01/26/16 0409 01/26/16 0411  WBC 8.4 9.5  --   NEUTROABS 6.7 7.9*  --   HGB 13.2 14.4 16.0*  HCT 38.3 42.2 47.0*  MCV 83.6 85.3  --   PLT 300 314  --    Basic Metabolic Panel:  Recent Labs Lab 01/24/16 0620 01/26/16 0409 01/26/16 0411  NA 135 133* 135  K 3.6 3.9 5.6*  CL 101 99* 100*  CO2 21* 22  --   GLUCOSE 135* 99 97  BUN 13 14 18   CREATININE 0.93 1.18* 1.10*  CALCIUM 10.0 10.3  --    GFR: CrCl cannot be calculated (Unknown ideal weight.). Liver Function Tests:  Recent Labs Lab 01/24/16 0620 01/26/16 0409  AST 28 28  ALT 21 17  ALKPHOS 64 61  BILITOT 0.8 1.5*  PROT 9.5* 9.1*  ALBUMIN 5.2* 4.9    Recent Labs Lab 01/24/16 0620  LIPASE 34   No results for input(s): AMMONIA in the last 168 hours. Coagulation Profile: No results for input(s): INR, PROTIME in the last 168 hours. Cardiac Enzymes: No results for input(s): CKTOTAL, CKMB, CKMBINDEX, TROPONINI in the  last 168 hours. BNP (last 3 results) No results for input(s): PROBNP in the last 8760 hours. HbA1C: No results for input(s): HGBA1C in the last 72 hours. CBG: No results for input(s): GLUCAP in the last 168 hours. Lipid Profile: No results for input(s): CHOL, HDL, LDLCALC, TRIG, CHOLHDL, LDLDIRECT in the last 72 hours. Thyroid Function Tests: No results for input(s): TSH, T4TOTAL, FREET4, T3FREE, THYROIDAB in the last 72 hours. Anemia Panel: No results for input(s): VITAMINB12, FOLATE, FERRITIN, TIBC, IRON, RETICCTPCT in the last 72 hours. Urine analysis:    Component Value Date/Time   COLORURINE AMBER* 01/26/2016 0509   APPEARANCEUR CLEAR 01/26/2016 0509   LABSPEC 1.033* 01/26/2016 0509   PHURINE 5.5 01/26/2016 0509   GLUCOSEU NEGATIVE 01/26/2016 0509   HGBUR NEGATIVE 01/26/2016 0509   BILIRUBINUR SMALL* 01/26/2016 0509   KETONESUR >80* 01/26/2016 0509   PROTEINUR 100* 01/26/2016 0509   NITRITE NEGATIVE 01/26/2016 0509   LEUKOCYTESUR SMALL*  01/26/2016 0509   Sepsis Labs: !!!!!!!!!!!!!!!!!!!!!!!!!!!!!!!!!!!!!!!!!!!! @LABRCNTIP (procalcitonin:4,lacticidven:4) )No results found for this or any previous visit (from the past 240 hour(s)).   Radiological Exams on Admission: Dg Chest Portable 1 View  01/26/2016  CLINICAL DATA:  Check nasogastric catheter placement EXAM: PORTABLE CHEST 1 VIEW COMPARISON:  01/26/2016 FINDINGS: Nasogastric catheter is now coiled within the stomach. The cardiac shadow is stable. The lungs are well aerated bilaterally. No focal infiltrate or sizable effusion is seen. IMPRESSION: Nasogastric catheter within the stomach. Electronically Signed   By: Inez Catalina M.D.   On: 01/26/2016 08:19   Dg Abd Acute W/chest  01/26/2016  CLINICAL DATA:  Generalized abdominal pain with nausea and vomiting for 2 days. EXAM: DG ABDOMEN ACUTE W/ 1V CHEST COMPARISON:  CT 01/24/2016 FINDINGS: The lungs are clear. Cardiomediastinal contours are normal. Small bowel dilatation in  the central abdomen is likely progressed from prior CT, currently measuring 3.4 cm. Air-fluid levels are seen. There is a small volume of stool throughout the colon. No radiopaque calculi. No acute osseous abnormalities are seen. IMPRESSION: Progressive small bowel dilatation with air-fluid levels. Progressive ileus versus developing small bowel obstruction. Electronically Signed   By: Jeb Levering M.D.   On: 01/26/2016 05:09    EKG: None  Assessment/Plan Principal Problem:   SBO (small bowel obstruction) (HCC) Active Problems:   Hyperkalemia   Dehydration   #1 small bowel obstruction Questionable etiology. Likely secondary to adhesions. Patient with prior history of myomectomy and C-sections 2. Patient presented worsening abdominal pain after prior presentation to the ED one day prior to admission, patient with absent bowel sounds. Patient with no bowel movement in 2 days. Patient with no flatus. Acute abdominal series concerning for worsening air-fluid levels. Will admit patient to MedSurg. Will check a CT abdomen and pelvis. Repeat potassium level. Check a magnesium level. Will keep nothing by mouth with bowel rest. IV fluids. IV pain medications. Antiemetics. Supportive care. General surgery has been consulted. Follow.  #2 hyperkalemia Repeat potassium.  #3 dehydration Likely secondary to problem #1 as well as nausea and emesis. Keep nothing by mouth for now. Hydrate with IV fluids.   DVT prophylaxis: Lovenox Code Status: Full Family Communication: Updated patient. No family at bedside. Disposition Plan: Home once small bowel obstruction has resolved. Consults called: GENERAL SURGERY:DR Rosenbower Admission status: Admit to Elwin Mocha MD [facility] Pager [phone]  If 7PM-7AM, please contact night-coverage [email or web address] Password [facility]  01/26/2016, 9:13 AM

## 2016-01-26 NOTE — ED Notes (Signed)
Provider verbalized order for NG tube,   Gather 18 fr tube per protocol, assisted with charge nurse 2 nasal attempts without successful outcome. Provider notified and in room for next attempt   Provider in room placing 14 fr tube by nasal passage, tube went down and was in place but pt was able to remove it before it could be secured.    Provider attempted OG tube with numbing agent, no success.

## 2016-01-26 NOTE — Progress Notes (Signed)
Patient asleep. On morphine PCA. Honeycomb dsg to abdomen intact, no bleeding. Vital signs obtained. Will continue to monitor.

## 2016-01-26 NOTE — ED Notes (Signed)
Attempted IV x2. 

## 2016-01-26 NOTE — Consult Note (Signed)
[facility] Surgery Consult Note  Janet Alvarez 05-Jul-1986  [id number].    Requesting MD: Dr. April Palumbo Chief Complaint/Reason for Consult: abdominal pain HPI:  30 y.o female with a PMH of intrauterine fibroids presents to [facility] with worsening abdominal pain. Pt states she was at ED on 5/29 with pain and nausea and was discharged home with zofran. Patient came back to ED today with worsening abdominal pain that is diffuse, sharp, and radiates to her back. She has not experienced similar pain in the past. Pain is associated with nausea and 4 episodes of voming in the past 2 days. Patients last BM was 2 days ago, Monday, and it was brown and formed. Denies hematemesis, hematochezia. Patient has not been eating much the past two days and states that even liquids "dont go down". Denies regular use of NSAIDs. Denies drug/cocaine use. Denies PMH of inflammatory bowel disease, SBO, or hernias. Past surgeries include a Myomectomy in 2014 and 2 cesarean sections (most recent 2014). No other abdominal surgeries. Denies a family history of colon cancer.  ROS:  Constitutional: positive for intermittent chills, no fevers, night sweats Pulm: denies SOB, DOE CV: denies CP, dizziness Abd: as above GU: denies dysuria, hematuria, frequency  History reviewed. No pertinent family history.  History reviewed. No pertinent past medical history.  History reviewed. No pertinent past surgical history.  Social History:  reports that she has never smoked. She does not have any smokeless tobacco history on file. She reports that she does not drink alcohol or use illicit drugs.  Allergies:  Allergies  Allergen Reactions  . Vicodin [Hydrocodone-Acetaminophen] Other (See Comments)    DIZZINESS    (Not in a hospital admission)  Blood pressure 130/89, pulse 75, temperature 98.6 F (37 C), temperature source Oral, resp. rate 18, last menstrual period 01/17/2016, SpO2 95 %. Physical Exam: General:  pleasant, AA female who is laying in bed in NAD, looks tired. HEENT: head is normocephalic, atraumatic.  Sclera are mildly injected. 16 G NG tube in left nare on intermittent suction.  Mouth/lips are dry with scant blood from NG placement. Heart: regular, rate, and rhythm.  No obvious murmurs, gallops, or rubs noted.  Palpable pedal pulses bilaterally Lungs: CTAB, no wheezes, rhonchi, or rales noted.  Respiratory effort nonlabored Abd: soft, mildly TTP in all 4 quadrants, moderately distended, hypoactive BS, no masses, hernias, or organomegaly. No peritoneal signs to shaking of hospital bed. MS: all 4 extremities are symmetrical with no cyanosis, clubbing, or edema. Psych: A&Ox3 with an appropriate affect.  Results for orders placed or performed during the hospital encounter of 01/25/16 (from the past 48 hour(s))  CBC with Differential/Platelet     Status: Abnormal   Collection Time: 01/26/16  4:09 AM  Result Value Ref Range   WBC 9.5 4.0 - 10.5 K/uL   RBC 4.95 3.87 - 5.11 MIL/uL   Hemoglobin 14.4 12.0 - 15.0 g/dL   HCT 42.2 36.0 - 46.0 %   MCV 85.3 78.0 - 100.0 fL   MCH 29.1 26.0 - 34.0 pg   MCHC 34.1 30.0 - 36.0 g/dL   RDW 14.6 11.5 - 15.5 %   Platelets 314 150 - 400 K/uL   Neutrophils Relative % 83 %   Neutro Abs 7.9 (H) 1.7 - 7.7 K/uL   Lymphocytes Relative 12 %   Lymphs Abs 1.2 0.7 - 4.0 K/uL   Monocytes Relative 5 %   Monocytes Absolute 0.5 0.1 - 1.0 K/uL   Eosinophils Relative 0 %  Eosinophils Absolute 0.0 0.0 - 0.7 K/uL   Basophils Relative 0 %   Basophils Absolute 0.0 0.0 - 0.1 K/uL  Comprehensive metabolic panel     Status: Abnormal   Collection Time: 01/26/16  4:09 AM  Result Value Ref Range   Sodium 133 (L) 135 - 145 mmol/L   Potassium 3.9 3.5 - 5.1 mmol/L   Chloride 99 (L) 101 - 111 mmol/L   CO2 22 22 - 32 mmol/L   Glucose, Bld 99 65 - 99 mg/dL   BUN 14 6 - 20 mg/dL   Creatinine, Ser 1.18 (H) 0.44 - 1.00 mg/dL   Calcium 10.3 8.9 - 10.3 mg/dL   Total Protein 9.1  (H) 6.5 - 8.1 g/dL   Albumin 4.9 3.5 - 5.0 g/dL   AST 28 15 - 41 U/L   ALT 17 14 - 54 U/L   Alkaline Phosphatase 61 38 - 126 U/L   Total Bilirubin 1.5 (H) 0.3 - 1.2 mg/dL   GFR calc non Af Amer >60 >60 mL/min   GFR calc Af Amer >60 >60 mL/min    Comment: (NOTE) The eGFR has been calculated using the CKD EPI equation. This calculation has not been validated in all clinical situations. eGFR's persistently <60 mL/min signify possible Chronic Kidney Disease.    Anion gap 12 5 - 15  I-stat chem 8, ed     Status: Abnormal   Collection Time: 01/26/16  4:11 AM  Result Value Ref Range   Sodium 135 135 - 145 mmol/L   Potassium 5.6 (H) 3.5 - 5.1 mmol/L   Chloride 100 (L) 101 - 111 mmol/L   BUN 18 6 - 20 mg/dL   Creatinine, Ser 1.10 (H) 0.44 - 1.00 mg/dL   Glucose, Bld 97 65 - 99 mg/dL   Calcium, Ion 1.12 1.12 - 1.23 mmol/L   TCO2 26 0 - 100 mmol/L   Hemoglobin 16.0 (H) 12.0 - 15.0 g/dL   HCT 47.0 (H) 36.0 - 46.0 %  Urinalysis, Routine w reflex microscopic (not at [facility])     Status: Abnormal   Collection Time: 01/26/16  5:09 AM  Result Value Ref Range   Color, Urine AMBER (A) YELLOW    Comment: BIOCHEMICALS MAY BE AFFECTED BY COLOR   APPearance CLEAR CLEAR   Specific Gravity, Urine 1.033 (H) 1.005 - 1.030   pH 5.5 5.0 - 8.0   Glucose, UA NEGATIVE NEGATIVE mg/dL   Hgb urine dipstick NEGATIVE NEGATIVE   Bilirubin Urine SMALL (A) NEGATIVE   Ketones, ur >80 (A) NEGATIVE mg/dL   Protein, ur 100 (A) NEGATIVE mg/dL   Nitrite NEGATIVE NEGATIVE   Leukocytes, UA SMALL (A) NEGATIVE  Urine microscopic-add on     Status: Abnormal   Collection Time: 01/26/16  5:09 AM  Result Value Ref Range   Squamous Epithelial / LPF 0-5 (A) NONE SEEN   WBC, UA 0-5 0 - 5 WBC/hpf   RBC / HPF NONE SEEN 0 - 5 RBC/hpf   Bacteria, UA FEW (A) NONE SEEN   Urine-Other MUCOUS PRESENT    Dg Chest Portable 1 View  01/26/2016  CLINICAL DATA:  Check nasogastric catheter placement EXAM: PORTABLE CHEST 1 VIEW COMPARISON:   01/26/2016 FINDINGS: Nasogastric catheter is now coiled within the stomach. The cardiac shadow is stable. The lungs are well aerated bilaterally. No focal infiltrate or sizable effusion is seen. IMPRESSION: Nasogastric catheter within the stomach. Electronically Signed   By: Inez Catalina M.D.   On: 01/26/2016 08:19  Dg Abd Acute W/chest  01/26/2016  CLINICAL DATA:  Generalized abdominal pain with nausea and vomiting for 2 days. EXAM: DG ABDOMEN ACUTE W/ 1V CHEST COMPARISON:  CT 01/24/2016 FINDINGS: The lungs are clear. Cardiomediastinal contours are normal. Small bowel dilatation in the central abdomen is likely progressed from prior CT, currently measuring 3.4 cm. Air-fluid levels are seen. There is a small volume of stool throughout the colon. No radiopaque calculi. No acute osseous abnormalities are seen. IMPRESSION: Progressive small bowel dilatation with air-fluid levels. Progressive ileus versus developing small bowel obstruction. Electronically Signed   By: Jeb Levering M.D.   On: 01/26/2016 05:09   Assessment/Plan SBO (small bowel obstruction) vs ileus- Abd Film with increased dilatation of small bowel compared to CT on 5/29, air-fluid levels present.  - repeat CT with contrast ordered by medicine  - NPO, NG tube to suction, bowel rest, IVF @ 125 mL/hr, pain control, antiemetics  - mobilize as tolerated  - consider gastrografin/delayed films - will discuss with Dr. Zella Richer  - BMP ordered for AM DVT proph - SCD's, Lovenox  Hyperkalemia - Potassium 5.6 @ 4:11 AM, STAT K and Mg ordered Dehydration  Fibroids - PMH Myomectomy    Jill Alexanders, [facility] Surgery 01/26/2016, 8:45 AM Pager: [phone] Mon-Fri 7:00 am-4:30 pm Sat-Sun 7:00 am-11:30 am

## 2016-01-26 NOTE — Progress Notes (Signed)
Patient arrived to [place] at around 1015. Alert and oriented x 3. NGT intact to L nare, set to LWIS, placement checked via auscultation. Placed comfortably in bed. Patient in OR.

## 2016-01-26 NOTE — ED Provider Notes (Signed)
CSN: [id number]     Arrival date & time 01/25/16  2146 History  By signing my name below, I, Tobe Sos, attest that this documentation has been prepared under the direction and in the presence of Kashawn Manzano, MD.  Electronically Signed: Tobe Sos, ED Scribe. 01/26/2016. 3:55 AM.   Chief Complaint  Patient presents with  . Abdominal Pain   Patient is a 30 y.o. female presenting with abdominal pain. The history is provided by the patient. No language interpreter was used.  Abdominal Pain Pain location:  Generalized Pain radiates to:  Does not radiate Pain severity:  Moderate Onset quality:  Gradual Duration:  3 days Timing:  Intermittent Progression:  Worsening Chronicity:  New Context: not sick contacts   Relieved by:  Nothing Worsened by:  Nothing tried Ineffective treatments:  None tried Associated symptoms: vomiting   Associated symptoms: no diarrhea and no dysuria    HPI Comments: Zeyda Hamed is a 30 y.o. female who presents to the Emergency Department complaining of gradual onset, gradually worsening abdominal pain onset 3 days ago. Pt reports associated vomiting. Pt was 2 days ago at [facility] ED for similar symptoms and was given a course of Zofran, and she states that her abdominal pain has worsened since then. Pt states that she has normal urine output but it is noticeably darker. Pt denies diarrhea and dysuria. Pt denies pertinent sick contact with similar symptoms. No alleviating factors noted.   History reviewed. No pertinent past medical history. History reviewed. No pertinent past surgical history. No family history on file. Social History  Substance Use Topics  . Smoking status: Never Smoker   . Smokeless tobacco: None  . Alcohol Use: No   OB History    No data available     Review of Systems  Gastrointestinal: Positive for vomiting and abdominal pain. Negative for diarrhea.  Genitourinary: Negative for dysuria.  All other systems reviewed and  are negative.   Allergies  Review of patient's allergies indicates no known allergies.  Home Medications   Prior to Admission medications   Medication Sig Start Date End Date Taking? Authorizing Provider  ciprofloxacin (CIPRO) 500 MG tablet Take 1 tablet (500 mg total) by mouth 2 (two) times daily. One po bid x 7 days 01/24/16  Yes Deno Etienne, DO  metroNIDAZOLE (FLAGYL) 500 MG tablet Take 1 tablet (500 mg total) by mouth 2 (two) times daily. One po bid x 7 days 01/24/16  Yes Deno Etienne, DO  ondansetron (ZOFRAN ODT) 4 MG disintegrating tablet 4mg  ODT q4 hours prn nausea/vomit 01/24/16  Yes Deno Etienne, DO  oxyCODONE (ROXICODONE) 5 MG immediate release tablet Take 1 tablet (5 mg total) by mouth every 4 (four) hours as needed for severe pain. Patient not taking: Reported on 01/26/2016 01/24/16   Deno Etienne, DO   BP 125/91 mmHg  Pulse 89  Temp(Src) 98.8 F (37.1 C) (Oral)  Resp 18  SpO2 100%  LMP 01/17/2016 (Approximate) Physical Exam  Constitutional: She is oriented to person, place, and time. She appears well-developed and well-nourished.  HENT:  Head: Normocephalic and atraumatic.  Mouth/Throat: Oropharynx is clear and moist and mucous membranes are normal. No oropharyngeal exudate.  Eyes: Conjunctivae and EOM are normal. Pupils are equal, round, and reactive to light.  Neck: Normal range of motion. Neck supple.  Cardiovascular: Normal rate, regular rhythm, normal heart sounds and intact distal pulses.  Exam reveals no gallop and no friction rub.   No murmur heard. RRR.  Pulmonary/Chest: Effort normal and breath sounds normal. No respiratory distress. She has no wheezes. She has no rales.  Lungs CTA bilaterally.   Abdominal: Soft. She exhibits no distension. Bowel sounds are decreased. There is tenderness. There is no rebound, no guarding, no tenderness at McBurney's point and negative Murphy's sign.  Musculoskeletal: Normal range of motion.  Neurological: She is alert and oriented to  person, place, and time. She has normal reflexes. She displays normal reflexes.  Skin: Skin is warm and dry.  Psychiatric: She has a normal mood and affect.  Nursing note and vitals reviewed.   ED Course  Procedures (including critical care time) DIAGNOSTIC STUDIES: Oxygen Saturation is 100% on RA, normal by my interpretation.   COORDINATION OF CARE: 3:49 AM-Discussed next steps with pt including IV fluids and a CBC. Pt verbalized understanding and is agreeable with the plan.   Labs Review Labs Reviewed - No data to display  Imaging Review Ct Abdomen Pelvis W Contrast  01/24/2016  CLINICAL DATA:  30 year old female with periumbilical and lower abdominal pain with vomiting. EXAM: CT ABDOMEN AND PELVIS WITH CONTRAST TECHNIQUE: Multidetector CT imaging of the abdomen and pelvis was performed using the standard protocol following bolus administration of intravenous contrast. CONTRAST:  100 cc Isovue-300 IV. COMPARISON:  None. FINDINGS: Lower chest: No significant pulmonary nodules or acute consolidative airspace disease. Hepatobiliary: Normal liver with no liver mass. Normal gallbladder with no radiopaque cholelithiasis. No biliary ductal dilatation. Pancreas: Normal, with no mass or duct dilation. Spleen: Normal size. No mass. Adrenals/Urinary Tract: Normal adrenals. Normal kidneys with no hydronephrosis and no renal mass. Normal bladder. Stomach/Bowel: Grossly normal stomach. The proximal small bowel appears collapsed without wall thickening. There are a few mildly dilated mid to distal small bowel loops measuring up to 3.2 cm diameter demonstrating fluid levels, mild small bowel wall thickening and mild surrounding fat haziness, without discrete caliber transition. Normal appendix. Normal large bowel with no diverticulosis, large bowel wall thickening or pericolonic fat stranding. Vascular/Lymphatic: Normal caliber abdominal aorta. Patent portal, splenic, hepatic and renal veins. No  pathologically enlarged lymph nodes in the abdomen or pelvis. Reproductive: Mildly enlarged myomatous uterus with small fibroids measuring up to 2.4 cm in the posterior fundal uterus. No adnexal mass. Other: No pneumoperitoneum, ascites or focal fluid collection. Musculoskeletal: No aggressive appearing focal osseous lesions. IMPRESSION: 1. Mildly dilated and mildly thick walled mid to distal small bowel loops with fluid levels and associated surrounding mild fat stranding. No discrete small bowel caliber transition. Findings favor a nonspecific infectious or inflammatory enteritis with associated mild ileus. 2. Mildly enlarged myomatous uterus. Electronically Signed   By: Ilona Sorrel M.D.   On: 01/24/2016 08:13   I have personally reviewed and evaluated these images and lab results as part of my medical decision-making.   EKG Interpretation None      MDM   Final diagnoses:  None   Filed Vitals:   01/26/16 0359 01/26/16 0600  BP: 104/69 120/80  Pulse: 92 73  Temp:  98.5 F (36.9 C)  Resp: 18 18   Results for orders placed or performed during the hospital encounter of 01/25/16  CBC with Differential/Platelet  Result Value Ref Range   WBC 9.5 4.0 - 10.5 K/uL   RBC 4.95 3.87 - 5.11 MIL/uL   Hemoglobin 14.4 12.0 - 15.0 g/dL   HCT 42.2 36.0 - 46.0 %   MCV 85.3 78.0 - 100.0 fL   MCH 29.1 26.0 - 34.0 pg   MCHC  34.1 30.0 - 36.0 g/dL   RDW 14.6 11.5 - 15.5 %   Platelets 314 150 - 400 K/uL   Neutrophils Relative % 83 %   Neutro Abs 7.9 (H) 1.7 - 7.7 K/uL   Lymphocytes Relative 12 %   Lymphs Abs 1.2 0.7 - 4.0 K/uL   Monocytes Relative 5 %   Monocytes Absolute 0.5 0.1 - 1.0 K/uL   Eosinophils Relative 0 %   Eosinophils Absolute 0.0 0.0 - 0.7 K/uL   Basophils Relative 0 %   Basophils Absolute 0.0 0.0 - 0.1 K/uL  Comprehensive metabolic panel  Result Value Ref Range   Sodium 133 (L) 135 - 145 mmol/L   Potassium 3.9 3.5 - 5.1 mmol/L   Chloride 99 (L) 101 - 111 mmol/L   CO2 22 22 -  32 mmol/L   Glucose, Bld 99 65 - 99 mg/dL   BUN 14 6 - 20 mg/dL   Creatinine, Ser 1.18 (H) 0.44 - 1.00 mg/dL   Calcium 10.3 8.9 - 10.3 mg/dL   Total Protein 9.1 (H) 6.5 - 8.1 g/dL   Albumin 4.9 3.5 - 5.0 g/dL   AST 28 15 - 41 U/L   ALT 17 14 - 54 U/L   Alkaline Phosphatase 61 38 - 126 U/L   Total Bilirubin 1.5 (H) 0.3 - 1.2 mg/dL   GFR calc non Af Amer >60 >60 mL/min   GFR calc Af Amer >60 >60 mL/min   Anion gap 12 5 - 15  Urinalysis, Routine w reflex microscopic (not at [facility])  Result Value Ref Range   Color, Urine AMBER (A) YELLOW   APPearance CLEAR CLEAR   Specific Gravity, Urine 1.033 (H) 1.005 - 1.030   pH 5.5 5.0 - 8.0   Glucose, UA NEGATIVE NEGATIVE mg/dL   Hgb urine dipstick NEGATIVE NEGATIVE   Bilirubin Urine SMALL (A) NEGATIVE   Ketones, ur >80 (A) NEGATIVE mg/dL   Protein, ur 100 (A) NEGATIVE mg/dL   Nitrite NEGATIVE NEGATIVE   Leukocytes, UA SMALL (A) NEGATIVE  Urine microscopic-add on  Result Value Ref Range   Squamous Epithelial / LPF 0-5 (A) NONE SEEN   WBC, UA 0-5 0 - 5 WBC/hpf   RBC / HPF NONE SEEN 0 - 5 RBC/hpf   Bacteria, UA FEW (A) NONE SEEN   Urine-Other MUCOUS PRESENT   I-stat chem 8, ed  Result Value Ref Range   Sodium 135 135 - 145 mmol/L   Potassium 5.6 (H) 3.5 - 5.1 mmol/L   Chloride 100 (L) 101 - 111 mmol/L   BUN 18 6 - 20 mg/dL   Creatinine, Ser 1.10 (H) 0.44 - 1.00 mg/dL   Glucose, Bld 97 65 - 99 mg/dL   Calcium, Ion 1.12 1.12 - 1.23 mmol/L   TCO2 26 0 - 100 mmol/L   Hemoglobin 16.0 (H) 12.0 - 15.0 g/dL   HCT 47.0 (H) 36.0 - 46.0 %   Ct Abdomen Pelvis W Contrast  01/24/2016  CLINICAL DATA:  30 year old female with periumbilical and lower abdominal pain with vomiting. EXAM: CT ABDOMEN AND PELVIS WITH CONTRAST TECHNIQUE: Multidetector CT imaging of the abdomen and pelvis was performed using the standard protocol following bolus administration of intravenous contrast. CONTRAST:  100 cc Isovue-300 IV. COMPARISON:  None. FINDINGS: Lower chest: No  significant pulmonary nodules or acute consolidative airspace disease. Hepatobiliary: Normal liver with no liver mass. Normal gallbladder with no radiopaque cholelithiasis. No biliary ductal dilatation. Pancreas: Normal, with no mass or duct dilation. Spleen: Normal  size. No mass. Adrenals/Urinary Tract: Normal adrenals. Normal kidneys with no hydronephrosis and no renal mass. Normal bladder. Stomach/Bowel: Grossly normal stomach. The proximal small bowel appears collapsed without wall thickening. There are a few mildly dilated mid to distal small bowel loops measuring up to 3.2 cm diameter demonstrating fluid levels, mild small bowel wall thickening and mild surrounding fat haziness, without discrete caliber transition. Normal appendix. Normal large bowel with no diverticulosis, large bowel wall thickening or pericolonic fat stranding. Vascular/Lymphatic: Normal caliber abdominal aorta. Patent portal, splenic, hepatic and renal veins. No pathologically enlarged lymph nodes in the abdomen or pelvis. Reproductive: Mildly enlarged myomatous uterus with small fibroids measuring up to 2.4 cm in the posterior fundal uterus. No adnexal mass. Other: No pneumoperitoneum, ascites or focal fluid collection. Musculoskeletal: No aggressive appearing focal osseous lesions. IMPRESSION: 1. Mildly dilated and mildly thick walled mid to distal small bowel loops with fluid levels and associated surrounding mild fat stranding. No discrete small bowel caliber transition. Findings favor a nonspecific infectious or inflammatory enteritis with associated mild ileus. 2. Mildly enlarged myomatous uterus. Electronically Signed   By: Ilona Sorrel M.D.   On: 01/24/2016 08:13   Dg Abd Acute W/chest  01/26/2016  CLINICAL DATA:  Generalized abdominal pain with nausea and vomiting for 2 days. EXAM: DG ABDOMEN ACUTE W/ 1V CHEST COMPARISON:  CT 01/24/2016 FINDINGS: The lungs are clear. Cardiomediastinal contours are normal. Small bowel dilatation  in the central abdomen is likely progressed from prior CT, currently measuring 3.4 cm. Air-fluid levels are seen. There is a small volume of stool throughout the colon. No radiopaque calculi. No acute osseous abnormalities are seen. IMPRESSION: Progressive small bowel dilatation with air-fluid levels. Progressive ileus versus developing small bowel obstruction. Electronically Signed   By: Jeb Levering M.D.   On: 01/26/2016 05:09    Medications  lidocaine (XYLOCAINE) 2 % viscous mouth solution 15 mL (not administered)  silver nitrate applicators applicator 1 Stick (not administered)  butamben-tetracaine-benzocaine (CETACAINE) spray 1 spray (not administered)  ondansetron (ZOFRAN-ODT) disintegrating tablet 4 mg (4 mg Oral Given 01/26/16 0229)  sodium chloride 0.9 % bolus 1,000 mL (0 mLs Intravenous Stopped 01/26/16 0558)  fentaNYL (SUBLIMAZE) injection 100 mcg (100 mcg Intravenous Given 01/26/16 0556)  sodium chloride 0.9 % bolus 1,000 mL (1,000 mLs Intravenous New Bag/Given 01/26/16 0555)  ondansetron (ZOFRAN) injection 4 mg (4 mg Intravenous Given 01/26/16 0555)   Will admit to medicine  Surgery will consult  I personally performed the services described in this documentation, which was scribed in my presence. The recorded information has been reviewed and is accurate.       Graeson Nouri, MD 01/26/16 0700

## 2016-01-26 NOTE — Transfer of Care (Signed)
Immediate Anesthesia Transfer of Care Note  Patient: Janet Alvarez  Procedure(s) Performed: Procedure(s): EXPLORATORY LAPAROTOMY,  LYSIS OF ADHESIONS FOR SMALL BOWEL OBSTRUCTION  (N/A)  Patient Location: PACU  Anesthesia Type:General  Level of Consciousness: awake, alert , oriented and patient cooperative  Airway & Oxygen Therapy: Patient Spontanous Breathing and Patient connected to face mask oxygen  Post-op Assessment: Report given to RN, Post -op Vital signs reviewed and stable and Patient moving all extremities X 4  Post vital signs: stable  Last Vitals:  Filed Vitals:   01/26/16 1034 01/26/16 1326  BP: 140/100 126/87  Pulse: 93 75  Temp: 36.4 C 36.5 C  Resp: 18 14    Last Pain:  Filed Vitals:   01/26/16 1335  PainSc: 9          Complications: No apparent anesthesia complications

## 2016-01-26 NOTE — Anesthesia Preprocedure Evaluation (Addendum)
Anesthesia Evaluation  Patient identified by MRN, date of birth, ID band Patient awake    Reviewed: Allergy & Precautions, H&P , Patient's Chart, lab work & pertinent test results, reviewed documented beta blocker date and time   Airway Mallampati: II  TM Distance: >3 FB Neck ROM: full  Mouth opening: Limited Mouth Opening  Dental no notable dental hx.    Pulmonary    Pulmonary exam normal breath sounds clear to auscultation       Cardiovascular  Rhythm:regular Rate:Normal     Neuro/Psych    GI/Hepatic   Endo/Other    Renal/GU      Musculoskeletal   Abdominal   Peds  Hematology   Anesthesia Other Findings MP IV; no hx of DI from Myomectomy in [place]  Reproductive/Obstetrics                            Anesthesia Physical Anesthesia Plan  ASA: II  Anesthesia Plan: General   Post-op Pain Management:    Induction: Intravenous, Rapid sequence and Cricoid pressure planned  Airway Management Planned: Oral ETT and Video Laryngoscope Planned  Additional Equipment:   Intra-op Plan:   Post-operative Plan: Extubation in OR  Informed Consent: I have reviewed the patients History and Physical, chart, labs and discussed the procedure including the risks, benefits and alternatives for the proposed anesthesia with the patient or authorized representative who has indicated his/her understanding and acceptance.   Dental Advisory Given and Dental advisory given  Plan Discussed with: CRNA and Surgeon  Anesthesia Plan Comments: (  Discussed general anesthesia, including possible nausea, instrumentation of airway, sore throat,pulmonary aspiration, etc. I asked if the were any outstanding questions, or  concerns before we proceeded. )        Anesthesia Quick Evaluation

## 2016-01-26 NOTE — ED Notes (Signed)
Patient transported to CT at this time. 

## 2016-01-26 NOTE — Anesthesia Postprocedure Evaluation (Signed)
Anesthesia Post Note  Patient: Janet Alvarez  Procedure(s) Performed: Procedure(s) (LRB): EXPLORATORY LAPAROTOMY,  LYSIS OF ADHESIONS FOR SMALL BOWEL OBSTRUCTION  (N/A)  Patient location during evaluation: PACU Anesthesia Type: General Level of consciousness: sedated Pain management: satisfactory to patient Vital Signs Assessment: post-procedure vital signs reviewed and stable Respiratory status: spontaneous breathing Cardiovascular status: stable Anesthetic complications: no    Last Vitals:  Filed Vitals:   01/26/16 1554 01/26/16 1627  BP:  124/86  Pulse:  63  Temp:  36.4 C  Resp: 13 14    Last Pain:  Filed Vitals:   01/26/16 1629  PainSc: [place]

## 2016-01-26 NOTE — ED Notes (Signed)
Pt stated she is unable to give a urine sample 

## 2016-01-26 NOTE — ED Notes (Signed)
Attempted IV without success.

## 2016-01-26 NOTE — Progress Notes (Signed)
[facility] informed staff on [place], patient needs to be weighed.

## 2016-01-26 NOTE — Op Note (Signed)
Operative Note  Janet Alvarez female 30 y.o. 01/26/2016  PREOPERATIVE DX:  Small bowel obstruction  POSTOPERATIVE DX:  Same due to adhesions  PROCEDURE:   Exploratory laparotomy, lysis of adhesions         Surgeon: Odis Hollingshead   Assistants: Dyann Kief, M.D.  Anesthesia: General endotracheal anesthesia  Indications:   This is a 30 year old female admitted with a small bowel obstruction. She has findings on CT concerning for closed loop obstruction and increasing pain despite pain medication concerning for intestinal compromise. She is now brought to the operating room for urgent exploratory laparotomy.    Procedure Detail:  She was brought to the operating room placed supine on the operating table and a general anesthetic was given. A Foley catheter was inserted. The abdominal wall was widely sterilely prepped and draped. A timeout was performed.  Midline incision was made beginning above the upper iliacus and extending toward the pelvis dividing the skin, subcutaneous tissue, fascia, and peritoneum. Upon entering the peritoneal cavity there was some straw-colored ascites that was evacuated. Down by the lower portion of the incision it was evident where decompressed normal bowel was and dilated bowel. There were 2 points of obstruction. There was a thick band adhesion between the posterior abdominal wall and small bowel leading to complete obstruction. This was divided relieving this point of obstruction. There is a second point of obstruction with adhesion between the small bowel and the uterus which is approximately 4 cm above the lower transverse incision. This adhesion was divided freeing this up. There was some ecchymosis of this segment of small bowel but inspecting it throughout the case it appeared viable and had a good pulse. No enterotomies were made.  The entire small bowel was run and there was no other evidence of obstruction points. Irrigation was performed of the  abdominal cavity and it was inspected. There is no evidence of organ injury or bleeding. NG tube position was confirmed.  The midline fascia was then closed with running double looped #1 PDS suture. The subcutaneous tissue was irrigated and the skin was closed with staples. A sterile dressing was applied.  She tolerated the procedure well without apparent complications. She was taken to the recovery room in satisfactory condition.    Findings:  A notable finding is that the uterus is densely adherent to the anterior abdominal wall proximally 4-5 cm superior to the lower transverse incision.  Estimated Blood Loss:  150 ml                      Complications:  * No complications entered in OR log *         Disposition: PACU - hemodynamically stable.         Condition: stable

## 2016-01-26 NOTE — ED Notes (Signed)
16 G NG placed by Andy Gauss RN in left nare at this time. Placement verified with auscultation in left upper quadrant. 200 cc of yellow/red fluid suctioned off initially. Patient attached to intermittent wall suction at 120 mmHg at this time. Notified MD, who ordered chest x-ray to verify placement.

## 2016-01-27 ENCOUNTER — Encounter (HOSPITAL_COMMUNITY): Payer: Self-pay | Admitting: General Surgery

## 2016-01-27 LAB — BASIC METABOLIC PANEL
ANION GAP: 6 (ref 5–15)
BUN: 9 mg/dL (ref 6–20)
CALCIUM: 7.7 mg/dL — AB (ref 8.9–10.3)
CO2: 21 mmol/L — AB (ref 22–32)
CREATININE: 0.78 mg/dL (ref 0.44–1.00)
Chloride: 112 mmol/L — ABNORMAL HIGH (ref 101–111)
Glucose, Bld: 128 mg/dL — ABNORMAL HIGH (ref 65–99)
Potassium: 4.5 mmol/L (ref 3.5–5.1)
SODIUM: 139 mmol/L (ref 135–145)

## 2016-01-27 LAB — CBC
HCT: 31.4 % — ABNORMAL LOW (ref 36.0–46.0)
HEMOGLOBIN: 10.9 g/dL — AB (ref 12.0–15.0)
MCH: 29.3 pg (ref 26.0–34.0)
MCHC: 34.7 g/dL (ref 30.0–36.0)
MCV: 84.4 fL (ref 78.0–100.0)
PLATELETS: 252 10*3/uL (ref 150–400)
RBC: 3.72 MIL/uL — AB (ref 3.87–5.11)
RDW: 15.5 % (ref 11.5–15.5)
WBC: 11.3 10*3/uL — ABNORMAL HIGH (ref 4.0–10.5)

## 2016-01-27 MED ORDER — KETOROLAC TROMETHAMINE 30 MG/ML IJ SOLN
30.0000 mg | Freq: Four times a day (QID) | INTRAMUSCULAR | Status: AC
  Administered 2016-01-27 – 2016-01-28 (×5): 30 mg via INTRAVENOUS
  Filled 2016-01-27 (×5): qty 1

## 2016-01-27 NOTE — Progress Notes (Signed)
1 Day Post-Op  Subjective: NG tube bothering her.  No flatus or BM.  Objective: Vital signs in last 24 hours: Temp:  [97.4 F (36.3 C)-98.7 F (37.1 C)] 98 F (36.7 C) (06/01 0520) Pulse Rate:  [63-93] 75 (06/01 0520) Resp:  [10-18] 15 (06/01 0520) BP: (107-140)/(61-105) 108/62 mmHg (06/01 0520) SpO2:  [95 %-100 %] 100 % (06/01 0520)    Intake/Output from previous day: 05/31 0701 - 06/01 0700 In: 2730 [I.V.:2700; NG/GT:30] Out: 2570 [Urine:1750; Emesis/NG output:720; Blood:100] Intake/Output this shift:    PE: General- In NAD Abdomen-soft, quiet, dressing dry  Lab Results:   Recent Labs  01/26/16 0409 01/26/16 0411 01/27/16 0338  WBC 9.5  --  11.3*  HGB 14.4 16.0* 10.9*  HCT 42.2 47.0* 31.4*  PLT 314  --  252   BMET  Recent Labs  01/26/16 0409 01/26/16 0411 01/26/16 0918 01/27/16 0338  NA 133* 135  --  139  K 3.9 5.6* 3.3* 4.5  CL 99* 100*  --  112*  CO2 22  --   --  21*  GLUCOSE 99 97  --  128*  BUN 14 18  --  9  CREATININE 1.18* 1.10*  --  0.78  CALCIUM 10.3  --   --  7.7*   PT/INR No results for input(s): LABPROT, INR in the last 72 hours. Comprehensive Metabolic Panel:    Component Value Date/Time   NA 139 01/27/2016 0338   NA 135 01/26/2016 0411   K 4.5 01/27/2016 0338   K 3.3* 01/26/2016 0918   CL 112* 01/27/2016 0338   CL 100* 01/26/2016 0411   CO2 21* 01/27/2016 0338   CO2 22 01/26/2016 0409   BUN 9 01/27/2016 0338   BUN 18 01/26/2016 0411   CREATININE 0.78 01/27/2016 0338   CREATININE 1.10* 01/26/2016 0411   GLUCOSE 128* 01/27/2016 0338   GLUCOSE 97 01/26/2016 0411   CALCIUM 7.7* 01/27/2016 0338   CALCIUM 10.3 01/26/2016 0409   AST 28 01/26/2016 0409   AST 28 01/24/2016 0620   ALT 17 01/26/2016 0409   ALT 21 01/24/2016 0620   ALKPHOS 61 01/26/2016 0409   ALKPHOS 64 01/24/2016 0620   BILITOT 1.5* 01/26/2016 0409   BILITOT 0.8 01/24/2016 0620   PROT 9.1* 01/26/2016 0409   PROT 9.5* 01/24/2016 0620   ALBUMIN 4.9 01/26/2016  0409   ALBUMIN 5.2* 01/24/2016 0620     Studies/Results: Ct Abdomen Pelvis W Contrast  01/26/2016  CLINICAL DATA:  Worsening abdominal pain radiating to back with nausea and vomiting over the past 2 days. EXAM: CT ABDOMEN AND PELVIS WITH CONTRAST TECHNIQUE: Multidetector CT imaging of the abdomen and pelvis was performed using the standard protocol following bolus administration of intravenous contrast. CONTRAST:  1104mL ISOVUE-300 IOPAMIDOL (ISOVUE-300) INJECTION 61% COMPARISON:  01/24/2016 FINDINGS: Lung bases are within normal. Abdominal images demonstrate a nasogastric tube with tip over the gastric fundus. The liver, spleen, pancreas, gallbladder and adrenal glands are within normal. Kidneys are normal. Interval worsening of multiple dilated fluid-filled small bowel loops compatible with a distal small bowel obstruction as these loops measure up to 4 cm in diameter. Transit implant is over the distal small bowel in the right lower quadrant as there may be 2 adjacent strictured small bowel loops suggesting possible closed-loop morphology. No free peritoneal air. No evidence of pneumatosis. Mild perisplenic fluid and free fluid over the right pericolic gutter as well as minimal patchy mesenteric fluid over the midline lower abdomen and small  amount of free fluid in the pelvis. Colon is decompressed. Vascular structures are within normal. Remaining pelvic images demonstrate the bladder and rectum to be within normal. There are several uterine fibroids. Adnexa unremarkable. Remaining bones soft tissues are within normal. IMPRESSION: Interval worsening of distal small bowel obstruction with transition point over the right lower quadrant with possible narrowing of 2 adjacent small bowel loops suggesting possible closed loop morphology. Mild associated ascites. Nasogastric tube with tip over the gastric fundus. Small uterine fibroids. These results were called by telephone at the time of interpretation on  01/26/2016 at 10:15 am to Dr. Irine Seal , who verbally acknowledged these results. Electronically Signed   By: Marin Olp M.D.   On: 01/26/2016 10:15   Dg Chest Portable 1 View  01/26/2016  CLINICAL DATA:  Check nasogastric catheter placement EXAM: PORTABLE CHEST 1 VIEW COMPARISON:  01/26/2016 FINDINGS: Nasogastric catheter is now coiled within the stomach. The cardiac shadow is stable. The lungs are well aerated bilaterally. No focal infiltrate or sizable effusion is seen. IMPRESSION: Nasogastric catheter within the stomach. Electronically Signed   By: Inez Catalina M.D.   On: 01/26/2016 08:19   Dg Abd Acute W/chest  01/26/2016  CLINICAL DATA:  Generalized abdominal pain with nausea and vomiting for 2 days. EXAM: DG ABDOMEN ACUTE W/ 1V CHEST COMPARISON:  CT 01/24/2016 FINDINGS: The lungs are clear. Cardiomediastinal contours are normal. Small bowel dilatation in the central abdomen is likely progressed from prior CT, currently measuring 3.4 cm. Air-fluid levels are seen. There is a small volume of stool throughout the colon. No radiopaque calculi. No acute osseous abnormalities are seen. IMPRESSION: Progressive small bowel dilatation with air-fluid levels. Progressive ileus versus developing small bowel obstruction. Electronically Signed   By: Jeb Levering M.D.   On: 01/26/2016 05:09    Anti-infectives: Anti-infectives    Start     Dose/Rate Route Frequency Ordered Stop   01/26/16 2000  ceFAZolin (ANCEF) IVPB 2g/100 mL premix     2 g 200 mL/hr over 30 Minutes Intravenous Every 8 hours 01/26/16 1511 01/26/16 2100   01/26/16 1400  ceFAZolin (ANCEF) IVPB 2g/100 mL premix  Status:  Discontinued     2 g 200 mL/hr over 30 Minutes Intravenous Every 8 hours 01/26/16 1337 01/26/16 1532      Assessment Principal Problem:   Adhesive SBO s/p expl lap, LOA 01/26/16-stable overnight; ABL anemia; fair pain control with PCA    LOS: 1 day   Plan: Add Toradol.  OOB.   Janet Alvarez  J 01/27/2016

## 2016-01-27 NOTE — Care Management Note (Signed)
Case Management Note  Patient Details  Name: Janet Alvarez MRN: [id number] Date of Birth: 11/05/1985  Subjective/Objective:   30 yo admitted with SBO.  S/P exp lap                 Action/Plan: From home with family.  CM following for DC needs.  Expected Discharge Date:   (unknown)               Expected Discharge Plan:  Home/Self Care  In-House Referral:     Discharge planning Services  CM Consult  Post Acute Care Choice:    Choice offered to:     DME Arranged:    DME Agency:     HH Arranged:    HH Agency:     Status of Service:  In process, will continue to follow  Medicare Important Message Given:    Date Medicare IM Given:    Medicare IM give by:    Date Additional Medicare IM Given:    Additional Medicare Important Message give by:     If discussed at [place] of Stay Meetings, dates discussed:    Additional CommentsLynnell Catalan, RN 01/27/2016, 11:13 AM  [phone]

## 2016-01-28 LAB — BASIC METABOLIC PANEL
ANION GAP: 5 (ref 5–15)
BUN: 6 mg/dL (ref 6–20)
CHLORIDE: 110 mmol/L (ref 101–111)
CO2: 24 mmol/L (ref 22–32)
Calcium: 7.9 mg/dL — ABNORMAL LOW (ref 8.9–10.3)
Creatinine, Ser: 0.62 mg/dL (ref 0.44–1.00)
Glucose, Bld: 103 mg/dL — ABNORMAL HIGH (ref 65–99)
Potassium: 3.4 mmol/L — ABNORMAL LOW (ref 3.5–5.1)
Sodium: 139 mmol/L (ref 135–145)

## 2016-01-28 LAB — CBC
HEMATOCRIT: 28 % — AB (ref 36.0–46.0)
Hemoglobin: 9.2 g/dL — ABNORMAL LOW (ref 12.0–15.0)
MCH: 29.3 pg (ref 26.0–34.0)
MCHC: 32.9 g/dL (ref 30.0–36.0)
MCV: 89.2 fL (ref 78.0–100.0)
PLATELETS: 204 10*3/uL (ref 150–400)
RBC: 3.14 MIL/uL — ABNORMAL LOW (ref 3.87–5.11)
RDW: 15.8 % — ABNORMAL HIGH (ref 11.5–15.5)
WBC: 7 10*3/uL (ref 4.0–10.5)

## 2016-01-28 MED ORDER — POTASSIUM CHLORIDE 10 MEQ/100ML IV SOLN
10.0000 meq | INTRAVENOUS | Status: AC
  Administered 2016-01-28 (×2): 10 meq via INTRAVENOUS
  Filled 2016-01-28 (×2): qty 100

## 2016-01-28 NOTE — Progress Notes (Signed)
2 Days Post-Op  Subjective: NG tube fell out yesterday.  No n/v.  No flatus or BM.  Not having much pain.  Objective: Vital signs in last 24 hours: Temp:  [98 F (36.7 C)-98.7 F (37.1 C)] 98.7 F (37.1 C) (06/02 0609) Pulse Rate:  [73-76] 73 (06/02 0609) Resp:  [10-18] 13 (06/02 0742) BP: (100-116)/(60-81) 100/60 mmHg (06/02 0609) SpO2:  [97 %-100 %] 99 % (06/02 0742) Last BM Date: 01/24/16  Intake/Output from previous day: 06/01 0701 - 06/02 0700 In: 120 [P.O.:120] Out: 1700 [Urine:1700] Intake/Output this shift: Total I/O In: -  Out: 260 [Urine:260]  PE: General- In NAD. Listening to music and dancing in room Abdomen-soft, quiet, dressing dry  Lab Results:   Recent Labs  01/27/16 0338 01/28/16 0334  WBC 11.3* 7.0  HGB 10.9* 9.2*  HCT 31.4* 28.0*  PLT 252 204   BMET  Recent Labs  01/27/16 0338 01/28/16 0334  NA 139 139  K 4.5 3.4*  CL 112* 110  CO2 21* 24  GLUCOSE 128* 103*  BUN 9 6  CREATININE 0.78 0.62  CALCIUM 7.7* 7.9*   PT/INR No results for input(s): LABPROT, INR in the last 72 hours. Comprehensive Metabolic Panel:    Component Value Date/Time   NA 139 01/28/2016 0334   NA 139 01/27/2016 0338   K 3.4* 01/28/2016 0334   K 4.5 01/27/2016 0338   CL 110 01/28/2016 0334   CL 112* 01/27/2016 0338   CO2 24 01/28/2016 0334   CO2 21* 01/27/2016 0338   BUN 6 01/28/2016 0334   BUN 9 01/27/2016 0338   CREATININE 0.62 01/28/2016 0334   CREATININE 0.78 01/27/2016 0338   GLUCOSE 103* 01/28/2016 0334   GLUCOSE 128* 01/27/2016 0338   CALCIUM 7.9* 01/28/2016 0334   CALCIUM 7.7* 01/27/2016 0338   AST 28 01/26/2016 0409   AST 28 01/24/2016 0620   ALT 17 01/26/2016 0409   ALT 21 01/24/2016 0620   ALKPHOS 61 01/26/2016 0409   ALKPHOS 64 01/24/2016 0620   BILITOT 1.5* 01/26/2016 0409   BILITOT 0.8 01/24/2016 0620   PROT 9.1* 01/26/2016 0409   PROT 9.5* 01/24/2016 0620   ALBUMIN 4.9 01/26/2016 0409   ALBUMIN 5.2* 01/24/2016 0620      Studies/Results: Ct Abdomen Pelvis W Contrast  01/26/2016  CLINICAL DATA:  Worsening abdominal pain radiating to back with nausea and vomiting over the past 2 days. EXAM: CT ABDOMEN AND PELVIS WITH CONTRAST TECHNIQUE: Multidetector CT imaging of the abdomen and pelvis was performed using the standard protocol following bolus administration of intravenous contrast. CONTRAST:  14mL ISOVUE-300 IOPAMIDOL (ISOVUE-300) INJECTION 61% COMPARISON:  01/24/2016 FINDINGS: Lung bases are within normal. Abdominal images demonstrate a nasogastric tube with tip over the gastric fundus. The liver, spleen, pancreas, gallbladder and adrenal glands are within normal. Kidneys are normal. Interval worsening of multiple dilated fluid-filled small bowel loops compatible with a distal small bowel obstruction as these loops measure up to 4 cm in diameter. Transit implant is over the distal small bowel in the right lower quadrant as there may be 2 adjacent strictured small bowel loops suggesting possible closed-loop morphology. No free peritoneal air. No evidence of pneumatosis. Mild perisplenic fluid and free fluid over the right pericolic gutter as well as minimal patchy mesenteric fluid over the midline lower abdomen and small amount of free fluid in the pelvis. Colon is decompressed. Vascular structures are within normal. Remaining pelvic images demonstrate the bladder and rectum to be within normal. There  are several uterine fibroids. Adnexa unremarkable. Remaining bones soft tissues are within normal. IMPRESSION: Interval worsening of distal small bowel obstruction with transition point over the right lower quadrant with possible narrowing of 2 adjacent small bowel loops suggesting possible closed loop morphology. Mild associated ascites. Nasogastric tube with tip over the gastric fundus. Small uterine fibroids. These results were called by telephone at the time of interpretation on 01/26/2016 at 10:15 am to Dr. Irine Seal , who verbally acknowledged these results. Electronically Signed   By: Marin Olp M.D.   On: 01/26/2016 10:15    Anti-infectives: Anti-infectives    Start     Dose/Rate Route Frequency Ordered Stop   01/26/16 2000  ceFAZolin (ANCEF) IVPB 2g/100 mL premix     2 g 200 mL/hr over 30 Minutes Intravenous Every 8 hours 01/26/16 1511 01/26/16 2100   01/26/16 1400  ceFAZolin (ANCEF) IVPB 2g/100 mL premix  Status:  Discontinued     2 g 200 mL/hr over 30 Minutes Intravenous Every 8 hours 01/26/16 1337 01/26/16 1532      Assessment Principal Problem:   Adhesive SBO s/p expl lap, LOA 01/26/16-stable without ng tube; ABL anemia but no clinical evidence of bleeding; mild hypokalemia; foley removed    LOS: 2 days   Plan: Reduce PCA.  Correct potassium.  Monitor anemia.  Wait for return of bowel function.  Remove dressing 6/3.   Janet Alvarez 01/28/2016

## 2016-01-28 NOTE — Progress Notes (Signed)
Patient ambulated several times this shift. Voided without difficulty.

## 2016-01-29 LAB — BASIC METABOLIC PANEL
Anion gap: 17 — ABNORMAL HIGH (ref 5–15)
BUN: 6 mg/dL (ref 6–20)
CALCIUM: 9.1 mg/dL (ref 8.9–10.3)
CHLORIDE: 102 mmol/L (ref 101–111)
CO2: 20 mmol/L — ABNORMAL LOW (ref 22–32)
CREATININE: 0.78 mg/dL (ref 0.44–1.00)
Glucose, Bld: 93 mg/dL (ref 65–99)
Potassium: 4.3 mmol/L (ref 3.5–5.1)
SODIUM: 139 mmol/L (ref 135–145)

## 2016-01-29 LAB — CBC
HCT: 30.3 % — ABNORMAL LOW (ref 36.0–46.0)
Hemoglobin: 10.3 g/dL — ABNORMAL LOW (ref 12.0–15.0)
MCH: 29.2 pg (ref 26.0–34.0)
MCHC: 34 g/dL (ref 30.0–36.0)
MCV: 85.8 fL (ref 78.0–100.0)
PLATELETS: 205 10*3/uL (ref 150–400)
RBC: 3.53 MIL/uL — AB (ref 3.87–5.11)
RDW: 15.2 % (ref 11.5–15.5)
WBC: 6.1 10*3/uL (ref 4.0–10.5)

## 2016-01-29 MED ORDER — MORPHINE SULFATE (PF) 2 MG/ML IV SOLN
1.0000 mg | INTRAVENOUS | Status: DC | PRN
  Administered 2016-01-30: 2 mg via INTRAVENOUS
  Administered 2016-01-30: 4 mg via INTRAVENOUS
  Filled 2016-01-29: qty 2
  Filled 2016-01-29: qty 1

## 2016-01-29 NOTE — Progress Notes (Signed)
3 Days Post-Op  Subjective: Denies nausea Passing flatus  Objective: Vital signs in last 24 hours: Temp:  [98.2 F (36.8 C)-99.4 F (37.4 C)] 98.6 F (37 C) (06/03 0614) Pulse Rate:  [64-73] 64 (06/03 0614) Resp:  [10-17] 16 (06/03 0614) BP: (101-115)/(58-75) 115/75 mmHg (06/03 0614) SpO2:  [94 %-100 %] 100 % (06/03 0614) Last BM Date: 01/24/16  Intake/Output from previous day: 06/02 0701 - 06/03 0700 In: 504.5 [I.V.:504.5] Out: 4510 [Urine:4510] Intake/Output this shift:    Abdomen soft, +BS  Lab Results:   Recent Labs  01/28/16 0334 01/29/16 0346  WBC 7.0 6.1  HGB 9.2* 10.3*  HCT 28.0* 30.3*  PLT 204 205   BMET  Recent Labs  01/28/16 0334 01/29/16 0346  NA 139 139  K 3.4* 4.3  CL 110 102  CO2 24 20*  GLUCOSE 103* 93  BUN 6 6  CREATININE 0.62 0.78  CALCIUM 7.9* 9.1   PT/INR No results for input(s): LABPROT, INR in the last 72 hours. ABG No results for input(s): PHART, HCO3 in the last 72 hours.  Invalid input(s): PCO2, PO2  Studies/Results: No results found.  Anti-infectives: Anti-infectives    Start     Dose/Rate Route Frequency Ordered Stop   01/26/16 2000  ceFAZolin (ANCEF) IVPB 2g/100 mL premix     2 g 200 mL/hr over 30 Minutes Intravenous Every 8 hours 01/26/16 1511 01/26/16 2100   01/26/16 1400  ceFAZolin (ANCEF) IVPB 2g/100 mL premix  Status:  Discontinued     2 g 200 mL/hr over 30 Minutes Intravenous Every 8 hours 01/26/16 1337 01/26/16 1532      Assessment/Plan: s/p Procedure(s): EXPLORATORY LAPAROTOMY,  LYSIS OF ADHESIONS FOR SMALL BOWEL OBSTRUCTION  (N/Alvarez)  Start clear liquid diet Decrease IVF  LOS: 3 days    Janet Alvarez 01/29/2016

## 2016-01-30 NOTE — Progress Notes (Signed)
4 Days Post-Op  Subjective: Still passing flatus, no bm yet Tolerating liquids  Objective: Vital signs in last 24 hours: Temp:  [98.4 F (36.9 C)-99.7 F (37.6 C)] 98.4 F (36.9 C) (06/04 0451) Pulse Rate:  [66-72] 66 (06/04 0451) Resp:  [12-17] 16 (06/04 0451) BP: (106-114)/(67-77) 106/67 mmHg (06/04 0451) SpO2:  [98 %-100 %] 100 % (06/04 0451) Last BM Date: 01/24/16  Intake/Output from previous day: 06/03 0701 - 06/04 0700 In: 1480 [P.O.:1480] Out: 5150 [Urine:5150] Intake/Output this shift:    Abdomen soft, incision clean  Lab Results:   Recent Labs  01/28/16 0334 01/29/16 0346  WBC 7.0 6.1  HGB 9.2* 10.3*  HCT 28.0* 30.3*  PLT 204 205   BMET  Recent Labs  01/28/16 0334 01/29/16 0346  NA 139 139  K 3.4* 4.3  CL 110 102  CO2 24 20*  GLUCOSE 103* 93  BUN 6 6  CREATININE 0.62 0.78  CALCIUM 7.9* 9.1   PT/INR No results for input(s): LABPROT, INR in the last 72 hours. ABG No results for input(s): PHART, HCO3 in the last 72 hours.  Invalid input(s): PCO2, PO2  Studies/Results: No results found.  Anti-infectives: Anti-infectives    Start     Dose/Rate Route Frequency Ordered Stop   01/26/16 2000  ceFAZolin (ANCEF) IVPB 2g/100 mL premix     2 g 200 mL/hr over 30 Minutes Intravenous Every 8 hours 01/26/16 1511 01/26/16 2100   01/26/16 1400  ceFAZolin (ANCEF) IVPB 2g/100 mL premix  Status:  Discontinued     2 g 200 mL/hr over 30 Minutes Intravenous Every 8 hours 01/26/16 1337 01/26/16 1532      Assessment/Plan: s/p Procedure(s): EXPLORATORY LAPAROTOMY,  LYSIS OF ADHESIONS FOR SMALL BOWEL OBSTRUCTION  (N/A)  Decrease IVF Full liquid diet  LOS: 4 days    Charlye Spare A 01/30/2016

## 2016-01-31 MED ORDER — OXYCODONE HCL 5 MG PO TABS: 5.0000 mg | ORAL_TABLET | ORAL | Status: DC | PRN

## 2016-01-31 MED ORDER — METHOCARBAMOL 500 MG PO TABS: 1000.0000 mg | ORAL_TABLET | Freq: Three times a day (TID) | ORAL | Status: AC | PRN

## 2016-01-31 MED ORDER — METHOCARBAMOL 500 MG PO TABS: 1000.0000 mg | ORAL_TABLET | Freq: Three times a day (TID) | ORAL | Status: DC | PRN

## 2016-01-31 MED ORDER — PANTOPRAZOLE SODIUM 40 MG PO TBEC: 40.0000 mg | DELAYED_RELEASE_TABLET | Freq: Every day | ORAL | Status: DC

## 2016-01-31 MED ORDER — OXYCODONE HCL 5 MG PO TABS: 5.0000 mg | ORAL_TABLET | Freq: Four times a day (QID) | ORAL | Status: AC | PRN

## 2016-01-31 MED FILL — oxyCODONE HCL 5 MG TABS: 5 | 3 days supply | Qty: 30 | Fill #0

## 2016-01-31 NOTE — Discharge Summary (Signed)
[facility] Discharge Summary   Patient ID: Janet Alvarez MRN: [id number] DOB/AGE: [birth year] 30 y.o.  Admit date: 01/25/2016 Discharge date: 01/31/2016  Admitting Diagnosis: SBO  Discharge Diagnosis Patient Active Problem List   Diagnosis Date Noted  . SBO (small bowel obstruction) ([place]) 01/26/2016  . Hyperkalemia 01/26/2016  . Dehydration 01/26/2016  . Fibroids     Consultants Dr. Grandville Silos - Internal Medicine  Imaging: No results found.  Procedures Dr. Zella Richer (01/26/16) - Exploratory laparotomy, lysis of adhesions  Hospital Course:  30 y/o  female with a PMH of intrauterine fibroids presents to [facility] with worsening abdominal pain. Pt states she was at ED on 5/29 with pain and nausea and was discharged home with zofran. Patient came back to ED today with worsening abdominal pain that is diffuse, sharp, and radiates to her back. She has not experienced similar pain in the past. Pain is associated with nausea and 4 episodes of voming in the past 2 days. Patients last BM was 2 days ago, Monday, and it was brown and formed. Denies hematemesis, hematochezia. Patient has not been eating much the past two days and states that even liquids "dont go down". Denies regular use of NSAIDs. Denies drug/cocaine use. Denies PMH of inflammatory bowel disease, SBO, or hernias.  Past surgeries include a Myomectomy in 2014 and 2 cesarean sections (most recent 2014). No other abdominal surgeries.  Denies a family history of colon cancer.  Workup showed SBO.  Patient was admitted and underwent procedure listed above.  Tolerated procedure well and was transferred to the floor.  Mild post-op ileus soon resolved and diet was advanced as tolerated.  On POD #5, the patient was voiding well, tolerating diet, ambulating well, pain well controlled, vital signs stable, incisions c/d/i and felt stable for discharge home.  Patient will follow up in our office in 2-3 weeks and knows to call with  questions or concerns.  She will get her staples out later this week.  She will call to confirm appointment date/time.       Medication List    STOP taking these medications        ciprofloxacin 500 MG tablet  Commonly known as:  CIPRO     metroNIDAZOLE 500 MG tablet  Commonly known as:  FLAGYL     ondansetron 4 MG disintegrating tablet  Commonly known as:  ZOFRAN ODT      TAKE these medications        methocarbamol 500 MG tablet  Commonly known as:  ROBAXIN  Take 2 tablets (1,000 mg total) by mouth every 8 (eight) hours as needed for muscle spasms.     oxyCODONE 5 MG immediate release tablet  Commonly known as:  Oxy IR/ROXICODONE  Take 1-2 tablets (5-10 mg total) by mouth every 6 (six) hours as needed for moderate pain or severe pain.         Follow-up Information    Follow up with [facility]. Go in 4 days.   Specialty:  General Surgery   Why:  For staple removal with Dr. Bertrum Sol nurse on Thurday or Friday   Contact information:   [address] [place] [phone]      Schedule an appointment as soon as possible for a visit with Odis Hollingshead, MD.   Specialty:  General Surgery   Why:  For post-operation check in 2-3 weeks, call to verify appointment date/time.   Contact information:   [place]  [address] [address] [phone]       Signed: Nat Christen, [facility] [phone]  01/31/2016, 10:52 AM

## 2016-01-31 NOTE — Progress Notes (Signed)
Discharge instructions explained to pt, one script given to pt. Pt refused to use wheelchair at discharge. Requested to walk out. Tech walked pt to side door closest to [employer]. Pt states she's going to call Melburn Popper for a ride home. Denies any nausea or pain. Mid abdominal incision clean and dry, staples intact.

## 2016-01-31 NOTE — Discharge Instructions (Signed)
[facility]      [facility] [phone]  OPEN ABDOMINAL SURGERY: POST OP INSTRUCTIONS  Always review your discharge instruction sheet given to you by the facility where your surgery was performed.  IF YOU HAVE DISABILITY OR FAMILY LEAVE FORMS, YOU MUST BRING THEM TO THE OFFICE FOR PROCESSING.  PLEASE DO NOT GIVE THEM TO YOUR DOCTOR.  1. A prescription for pain medication may be given to you upon discharge.  Take your pain medication as prescribed, if needed.  If narcotic pain medicine is not needed, then you may take acetaminophen (Tylenol) or ibuprofen (Advil) as needed. 2. Take your usually prescribed medications unless otherwise directed. 3. If you need a refill on your pain medication, please contact your pharmacy. They will contact our office to request authorization.  Prescriptions will not be filled after 5pm or on week-ends. 4. You should follow a light diet the first few days after arrival home, such as soup and crackers, pudding, etc.unless your doctor has advised otherwise. A high-fiber, low fat diet can be resumed as tolerated.   Be sure to include lots of fluids daily. Most patients will experience some swelling and bruising on the chest and neck area.  Ice packs will help.  Swelling and bruising can take several days to resolve 5. Most patients will experience some swelling and bruising in the area of the incision. Ice pack will help. Swelling and bruising can take several days to resolve..  6. It is common to experience some constipation if taking pain medication after surgery.  Increasing fluid intake and taking a stool softener will usually help or prevent this problem from occurring.  A mild laxative (Milk of Magnesia or Miralax) should be taken according to package directions if there are no bowel movements after 48 hours. 7.  You may have steri-strips (small skin tapes) in place directly over the incision.  These strips should be left on the skin for 7-10 days.  If your  surgeon used skin glue on the incision, you may shower in 24 hours.  The glue will flake off over the next 2-3 weeks.  Any sutures or staples will be removed at the office during your follow-up visit. You may find that a light gauze bandage over your incision may keep your staples from being rubbed or pulled. You may shower and replace the bandage daily. 8. ACTIVITIES:  You may resume regular (light) daily activities beginning the next day--such as daily self-care, walking, climbing stairs--gradually increasing activities as tolerated.  You may have sexual intercourse when it is comfortable.  Refrain from any heavy lifting or straining until approved by your doctor. a. You may drive when you no longer are taking prescription pain medication, you can comfortably wear a seatbelt, and you can safely maneuver your car and apply brakes b. Return to Work: ___________________________________ 9. You should see your doctor in the office for a follow-up appointment approximately two weeks after your surgery.  Make sure that you call for this appointment within a day or two after you arrive home to insure a convenient appointment time. OTHER INSTRUCTIONS:  _____________________________________________________________ _____________________________________________________________  WHEN TO CALL YOUR DOCTOR: 1. Fever over 101.0 2. Inability to urinate 3. Nausea and/or vomiting 4. Extreme swelling or bruising 5. Continued bleeding from incision. 6. Increased pain, redness, or drainage from the incision. 7. Difficulty swallowing or breathing 8. Muscle cramping or spasms. 9. Numbness or tingling in hands or feet or around lips.  The clinic staff is available to   answer your questions during regular business hours.  Please don't hesitate to call and ask to speak to one of the nurses if you have concerns.  For further questions, please visit [email or web address]   

## 2016-01-31 NOTE — Progress Notes (Signed)
[facility] Progress Note  5 Days Post-Op  Subjective: Pt doing well, tired.  No abdominal pain or N/V, tolerating diet well.  Had a medium sized BM yesterday.  Having flatus, urinating well.    Objective: Vital signs in last 24 hours: Temp:  [97.7 F (36.5 C)-99.6 F (37.6 C)] 99 F (37.2 C) (06/05 0615) Pulse Rate:  [63-75] 75 (06/05 0615) Resp:  [15-16] 16 (06/04 2204) BP: (112-118)/(75-80) 118/77 mmHg (06/05 0615) SpO2:  [100 %] 100 % (06/05 0615) Weight:  [152 lb (68.947 kg)] 152 lb (68.947 kg) (06/05 0615) Last BM Date: 01/30/16  Intake/Output from previous day: 06/04 0701 - 06/05 0700 In: -  Out: 2800 [Urine:2800] Intake/Output this shift:    PE: Gen:  Alert, NAD, pleasant Abd: Soft, NT/ND, +BS, no HSM, incisions C/D/I with staples in place   Lab Results:   Recent Labs  01/29/16 0346  WBC 6.1  HGB 10.3*  HCT 30.3*  PLT 205   BMET  Recent Labs  01/29/16 0346  NA 139  K 4.3  CL 102  CO2 20*  GLUCOSE 93  BUN 6  CREATININE 0.78  CALCIUM 9.1   PT/INR No results for input(s): LABPROT, INR in the last 72 hours. CMP     Component Value Date/Time   NA 139 01/29/2016 0346   K 4.3 01/29/2016 0346   CL 102 01/29/2016 0346   CO2 20* 01/29/2016 0346   GLUCOSE 93 01/29/2016 0346   BUN 6 01/29/2016 0346   CREATININE 0.78 01/29/2016 0346   CALCIUM 9.1 01/29/2016 0346   PROT 9.1* 01/26/2016 0409   ALBUMIN 4.9 01/26/2016 0409   AST 28 01/26/2016 0409   ALT 17 01/26/2016 0409   ALKPHOS 61 01/26/2016 0409   BILITOT 1.5* 01/26/2016 0409   GFRNONAA >60 01/29/2016 0346   GFRAA >60 01/29/2016 0346   Lipase     Component Value Date/Time   LIPASE 34 01/24/2016 0620       Studies/Results: No results found.  Anti-infectives: Anti-infectives    Start     Dose/Rate Route Frequency Ordered Stop   01/26/16 2000  ceFAZolin (ANCEF) IVPB 2g/100 mL premix     2 g 200 mL/hr over 30 Minutes Intravenous Every 8 hours 01/26/16 1511 01/26/16  2100   01/26/16 1400  ceFAZolin (ANCEF) IVPB 2g/100 mL premix  Status:  Discontinued     2 g 200 mL/hr over 30 Minutes Intravenous Every 8 hours 01/26/16 1337 01/26/16 1532       Assessment/Plan SBO POD #5 s/p Ex Lap, LOA - Dr. Zella Richer 01/26/16 -On regular diet -Ambulate and IS -SCD's and lovenox -Staples to be removed POD #10 -Likely discharge this afternoon.     LOS: 5 days    Nat Christen 01/31/2016, 7:54 AM Pager: [phone]  (7am - 4:30pm M-F; 7am - 11:30am Sa/Su)

## 2018-01-09 IMAGING — CT CT ABD-PELV W/ CM
2 of 4 series · 16 of 46 positions shown, 18 images · IV contrast (iopamidol)
Comparison: 01/24/2016

CLINICAL DATA: Worsening abdominal pain radiating to back with
nausea and vomiting over the past 2 days.

EXAM:
CT ABDOMEN AND PELVIS WITH CONTRAST
TECHNIQUE: Multidetector CT imaging of the abdomen and pelvis was performed
using the standard protocol following bolus administration of
intravenous contrast.
CONTRAST:  100mL [id number] IOPAMIDOL ([id number]) INJECTION 61%

[Series 2: abd/pel with · axial · 0.68mm/px · z∈[-431,-41]mm · 13 of 88 slices shown, 15 images]
[im 5/88  soft-tissue]
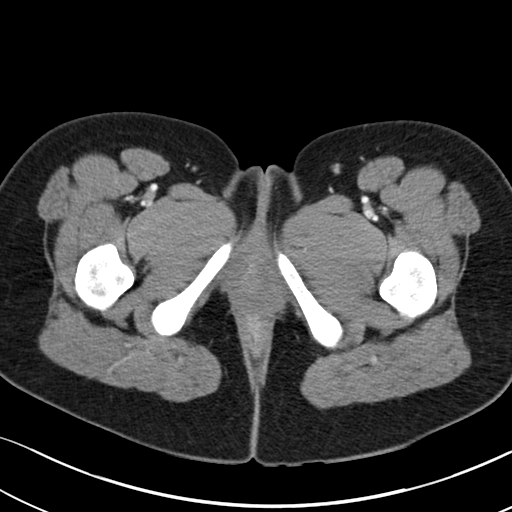
[im 5/88  bone]
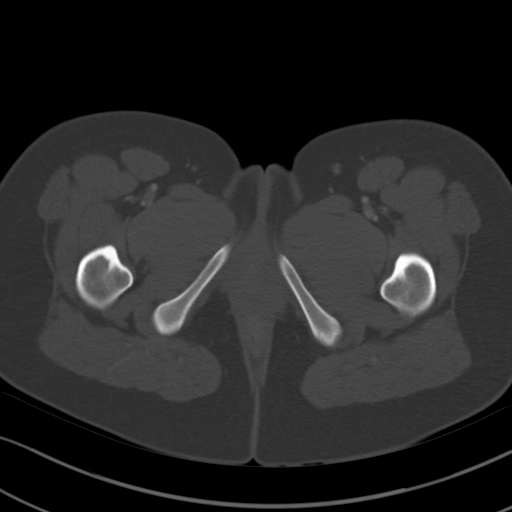
[im 13/88  soft-tissue]
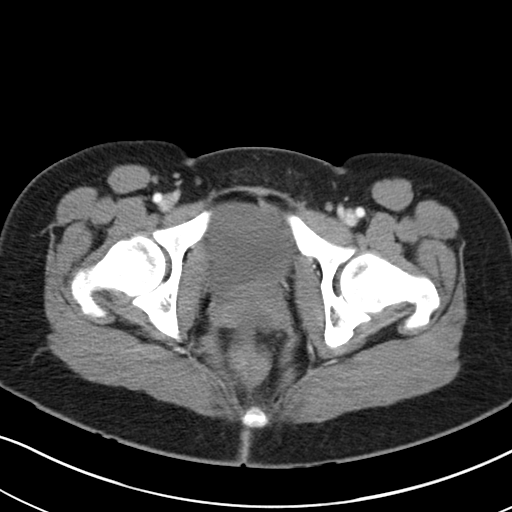
[im 17/88  soft-tissue]
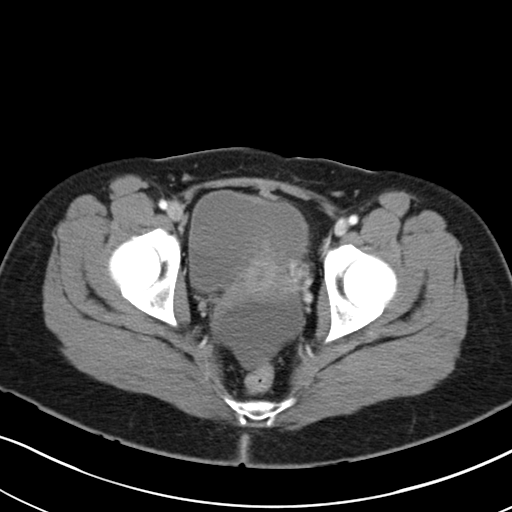
[im 25/88  soft-tissue]
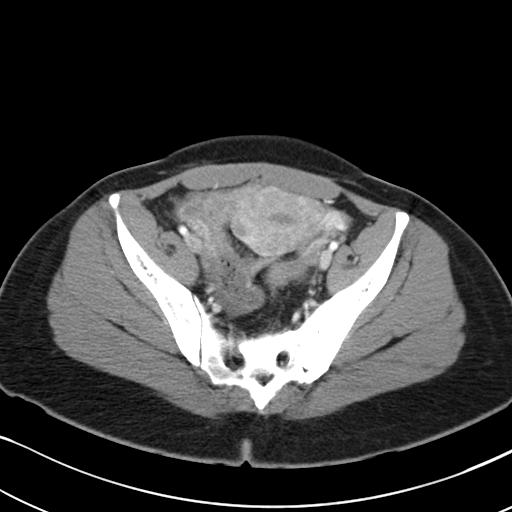
[im 30/88  soft-tissue]
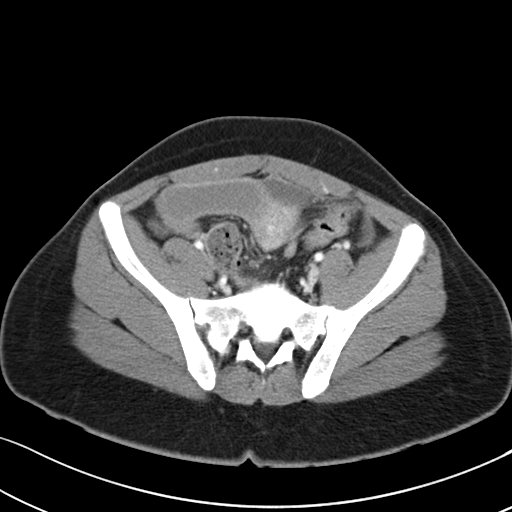
[im 38/88  soft-tissue]
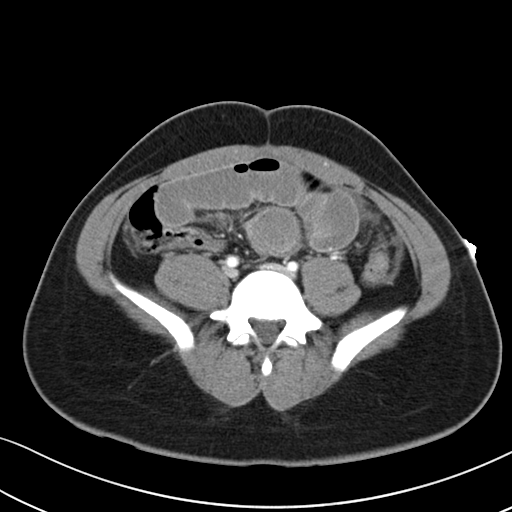
[im 46/88  soft-tissue]
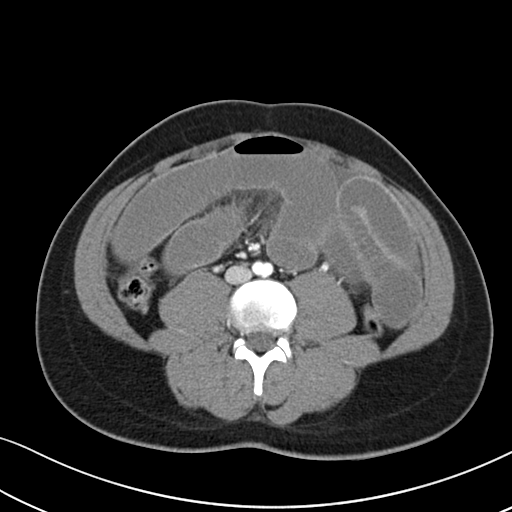
[im 50/88  soft-tissue]
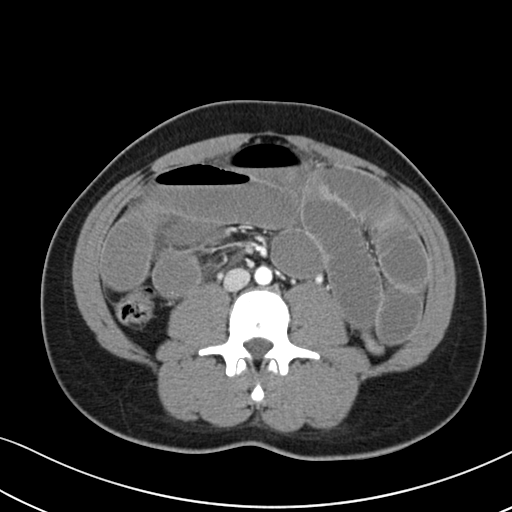
[im 59/88  soft-tissue]
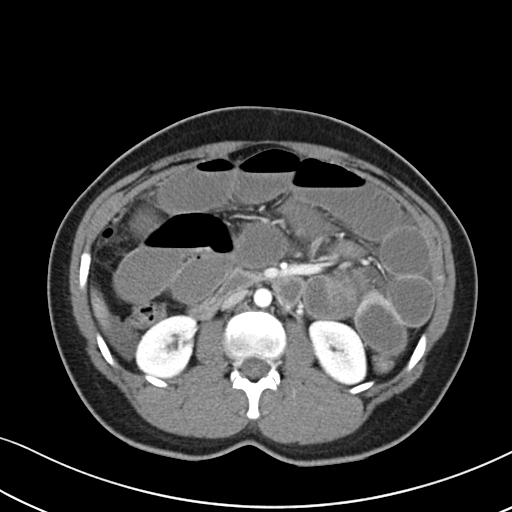
[im 59/88  bone]
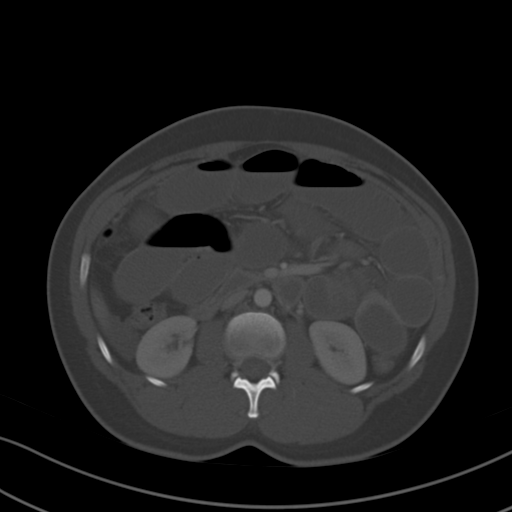
[im 63/88  soft-tissue]
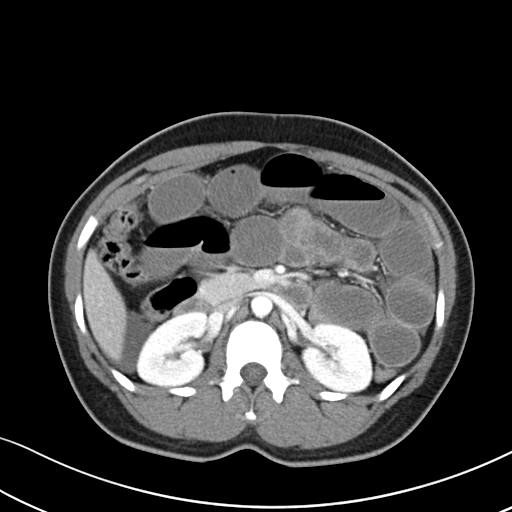
[im 71/88  soft-tissue]
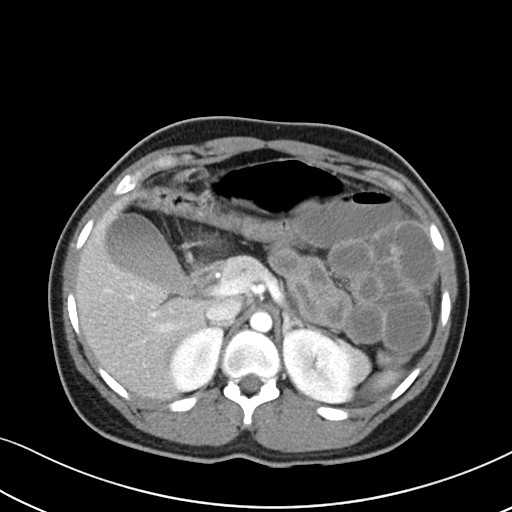
[im 75/88  soft-tissue]
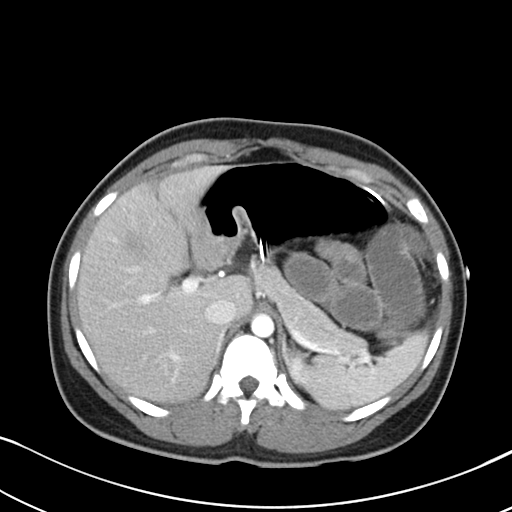
[im 83/88  soft-tissue]
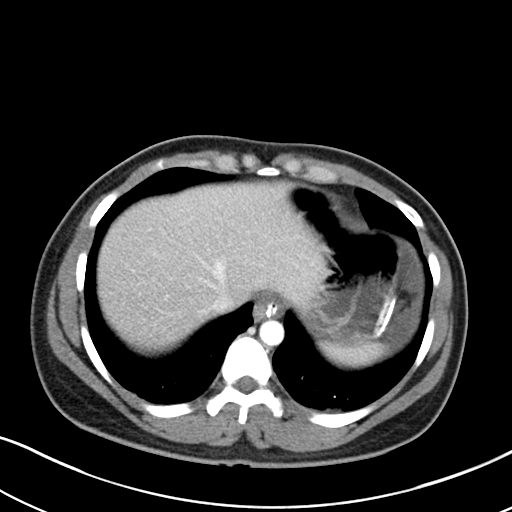

[Series 3: coronal a/|p · coronal · 0.85mm/px · 3 of 104 slices shown]
[im 35/104  soft-tissue]
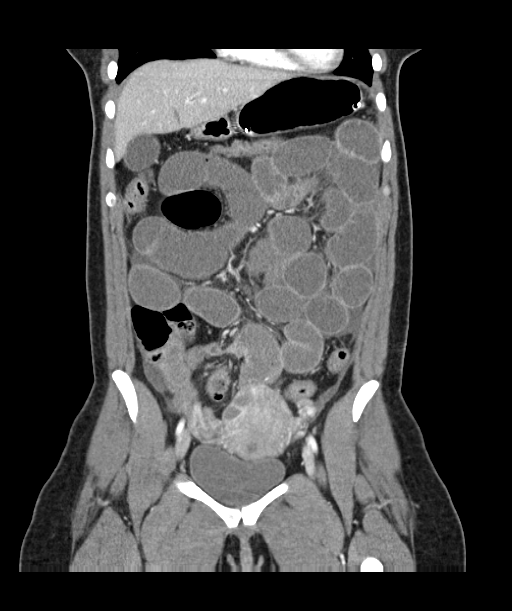
[im 46/104  soft-tissue]
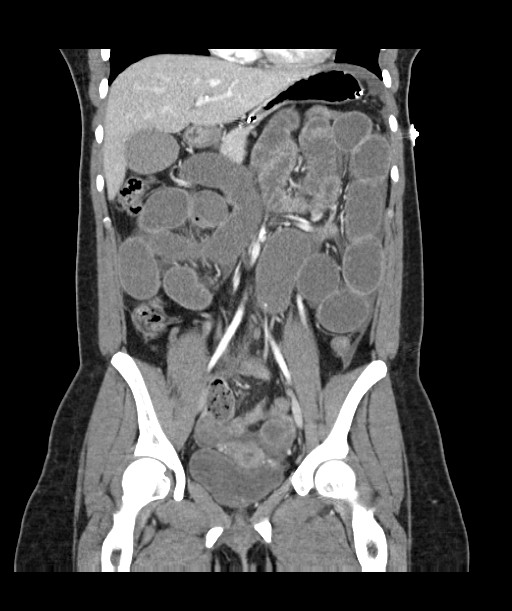
[im 58/104  soft-tissue]
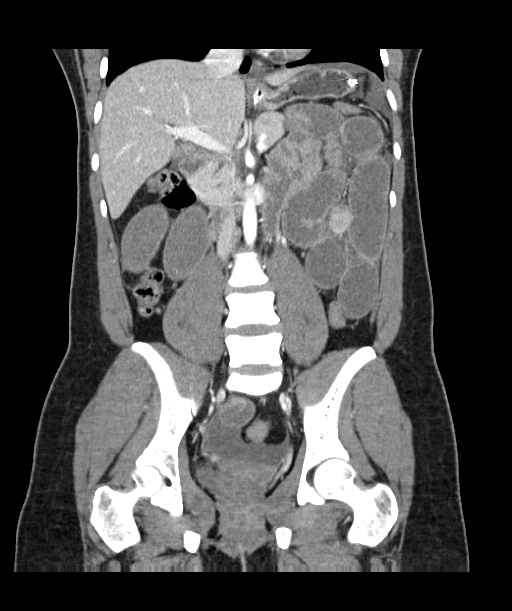

[16 of 46 positions shown; findings below may reference images not displayed]

FINDINGS: Lung bases are within normal.

Abdominal images demonstrate a nasogastric tube with tip over the
gastric fundus.

The liver, spleen, pancreas, gallbladder and adrenal glands are
within normal. Kidneys are normal.

Interval worsening of multiple dilated fluid-filled small bowel
loops compatible with a distal small bowel obstruction as these
loops measure up to 4 cm in diameter. Transit implant is over the
distal small bowel in the right lower quadrant as there [DATE]
adjacent strictured small bowel loops suggesting possible
closed-loop morphology. No free peritoneal air. No evidence of
pneumatosis. Mild perisplenic fluid and free fluid over the right
pericolic gutter as well as minimal patchy mesenteric fluid over the
midline lower abdomen and small amount of free fluid in the pelvis.
Colon is decompressed.

Vascular structures are within normal.

Remaining pelvic images demonstrate the bladder and rectum to be
within normal. There are several uterine fibroids. Adnexa
unremarkable. Remaining bones soft tissues are within normal.
IMPRESSION: Interval worsening of distal small bowel obstruction with transition
point over the right lower quadrant with possible narrowing of 2
adjacent small bowel loops suggesting possible closed loop
morphology. Mild associated ascites.

Nasogastric tube with tip over the gastric fundus.

Small uterine fibroids.

These results were called by telephone at the time of interpretation
on 01/26/2016 at [DATE] to Dr. SUJANTO DUASATU , who verbally
acknowledged these results.
